# Patient Record
Sex: Female | Born: 1995 | Race: Black or African American | Hispanic: No | Marital: Single | Smoking: Former smoker
Health system: Southern US, Community
[De-identification: ages and names within clinical notes are randomized; demographics above are authoritative.]

## PROBLEM LIST (undated history)

## (undated) DIAGNOSIS — B9689 Other specified bacterial agents as the cause of diseases classified elsewhere: Secondary | ICD-10-CM

## (undated) HISTORY — PX: WRIST SURGERY: SHX841

---
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
## 2017-02-25 ENCOUNTER — Emergency Department (HOSPITAL_BASED_OUTPATIENT_CLINIC_OR_DEPARTMENT_OTHER): Payer: Medicaid Other

## 2017-02-25 ENCOUNTER — Encounter (HOSPITAL_BASED_OUTPATIENT_CLINIC_OR_DEPARTMENT_OTHER): Payer: Self-pay | Admitting: Emergency Medicine

## 2017-02-25 ENCOUNTER — Emergency Department (HOSPITAL_BASED_OUTPATIENT_CLINIC_OR_DEPARTMENT_OTHER)
Admission: EM | Admit: 2017-02-25 | Discharge: 2017-02-25 | Disposition: A | Discharge status: Home / Self Care | Payer: Medicaid Other | Attending: Emergency Medicine | Admitting: Emergency Medicine

## 2017-02-25 DIAGNOSIS — R0789 Other chest pain: Secondary | ICD-10-CM | POA: Insufficient documentation

## 2017-02-25 DIAGNOSIS — R1013 Epigastric pain: Secondary | ICD-10-CM | POA: Insufficient documentation

## 2017-02-25 MED ORDER — NAPROXEN 375 MG PO TABS: 375.0000 mg | ORAL_TABLET | Freq: Two times a day (BID) | ORAL | 0 refills | Status: AC

## 2017-02-25 MED ORDER — RANITIDINE HCL 150 MG PO TABS: 150.0000 mg | ORAL_TABLET | Freq: Two times a day (BID) | ORAL | 0 refills | Status: AC

## 2017-02-25 MED ORDER — NAPROXEN 375 MG PO TABS: 375.0000 mg | ORAL_TABLET | Freq: Two times a day (BID) | ORAL | 0 refills | Status: DC

## 2017-02-25 MED ORDER — SUCRALFATE 1 GM/10ML PO SUSP: 1.0000 g | Freq: Three times a day (TID) | ORAL | 0 refills | Status: DC

## 2017-02-25 MED ORDER — GI COCKTAIL ~~LOC~~
30.0000 mL | Freq: Once | ORAL | Status: AC
  Administered 2017-02-25: 30 mL via ORAL
  Filled 2017-02-25: qty 30

## 2017-02-25 MED ORDER — SUCRALFATE 1 GM/10ML PO SUSP: 1.0000 g | Freq: Three times a day (TID) | ORAL | 0 refills | Status: AC

## 2017-02-25 MED ORDER — RANITIDINE HCL 150 MG PO TABS: 150.0000 mg | ORAL_TABLET | Freq: Two times a day (BID) | ORAL | 0 refills | Status: DC

## 2017-02-25 NOTE — ED Triage Notes (Signed)
PT presents to ED with complaints of chest pain and epigastric pain for the past couple weeks.

## 2017-02-25 NOTE — ED Provider Notes (Signed)
[facility] Provider Note   CSN: [id number] Arrival date & time: 02/25/17  2121  By signing my name below, I, Donna Bartlett, attest that this documentation has been prepared under the direction and in the presence of physician practitioner, Shaune Pollack, MD. Electronically Signed: Linna Bartlett, Scribe. 02/25/2017. 10:27 PM.  History   Chief Complaint Chief Complaint  Patient presents with  . Chest Pain    The history is provided by the patient. No language interpreter was used.     HPI Comments: Donna Bartlett is a 21 y.o. female with no pertinent PMHx who presents to the Emergency Department complaining of intermittent, sharp, central chest pain radiating into her epigastrium for a few weeks, constant today. Her pain initially presented a few weeks ago while at rest. Patient reports that prior to today, her pain would present every couple of hours and last for "a while". She states her pain is worse with movement. No alleviating factors noted. Patient notes she has had some nausea and one episode of vomiting today. No recent increased stress. No h/o acid reflux. No FMHx of heart problems. No frequent NSAID use. No hormone therapy. She denies fevers, cough, or any other associated symptoms.  History reviewed. No pertinent past medical history.  There are no active problems to display for this patient.   History reviewed. No pertinent surgical history.  OB History    No data available       Home Medications    Prior to Admission medications   Medication Sig Start Date End Date Taking? Authorizing Provider  Aspirin-Acetaminophen-Caffeine (PAIN RELIEVER PLUS PO) Take 10 mLs by mouth daily as needed. For pain and fever.    Historical Provider, MD  naproxen (NAPROSYN) 375 MG tablet Take 1 tablet (375 mg total) by mouth 2 (two) times daily with a meal. 02/25/17 03/04/17  Shaune Pollack, MD  naproxen sodium (ANAPROX) 275 MG tablet Take 1 tablet twice daily as needed for  painful rash. 11/03/13   John Molpus, MD  Phenylephrine-Pheniramine-DM [facility] COLD & COUGH PO) Take 1 packet by mouth daily as needed. For cold symptoms    Historical Provider, MD  Pseudoeph-Doxylamine-DM-APAP (NYQUIL PO) Take 1 capsule by mouth daily as needed. For cold symptoms    Historical Provider, MD  ranitidine (ZANTAC) 150 MG tablet Take 1 tablet (150 mg total) by mouth 2 (two) times daily. 02/25/17 03/04/17  Shaune Pollack, MD  sucralfate (CARAFATE) 1 GM/10ML suspension Take 10 mLs (1 g total) by mouth 4 (four) times daily -  with meals and at bedtime. 02/25/17 03/04/17  Shaune Pollack, MD    Family History No family history on file.  Social History Social History  Substance Use Topics  . Smoking status: Never Smoker  . Smokeless tobacco: Not on file  . Alcohol use No     Allergies   Other   Review of Systems Review of Systems  Constitutional: Negative for chills, fatigue and fever.  HENT: Negative for congestion and rhinorrhea.   Eyes: Negative for visual disturbance.  Respiratory: Negative for cough, shortness of breath and wheezing.   Cardiovascular: Positive for chest pain. Negative for leg swelling.  Gastrointestinal: Positive for abdominal pain, nausea and vomiting (one episode). Negative for diarrhea.  Genitourinary: Negative for dysuria and flank pain.  Musculoskeletal: Negative for neck pain and neck stiffness.  Skin: Negative for rash and wound.  Allergic/Immunologic: Negative for immunocompromised state.  Neurological: Negative for syncope, weakness and headaches.  All other systems reviewed and are  negative.  Physical Exam Updated Vital Signs BP 114/77   Pulse 67   Temp 99.6 F (37.6 C) (Oral)   Resp 18   Ht  (1.651 m)   Wt 148 lb 5 oz (67.3 kg)   LMP 02/13/2017   SpO2 100%   BMI 24.68 kg/m   Physical Exam  Constitutional: She is oriented to person, place, and time. She appears well-developed and well-nourished. No distress.  HENT:  Head:  Normocephalic and atraumatic.  Eyes: Conjunctivae are normal.  Neck: Neck supple.  Cardiovascular: Normal rate, regular rhythm and normal heart sounds.  Exam reveals no friction rub.   No murmur heard. Pulmonary/Chest: Effort normal and breath sounds normal. No respiratory distress. She has no wheezes. She has no rales. She exhibits tenderness (significant pinpoint TTP over mid and lower sternum).  Abdominal: Soft. Bowel sounds are normal. She exhibits no distension. There is tenderness (mild epigatric TTP; no guarding). There is no rebound and no guarding.  Musculoskeletal: She exhibits no edema.  Neurological: She is alert and oriented to person, place, and time. She exhibits normal muscle tone.  Skin: Skin is warm. Capillary refill takes less than 2 seconds.  Psychiatric: She has a normal mood and affect.  Nursing note and vitals reviewed.  ED Treatments / Results  Labs (all labs ordered are listed, but only abnormal results are displayed) Labs Reviewed - No data to display  EKG  EKG Interpretation  Date/Time:  Saturday February 25 2017 22:49:21 EDT Ventricular Rate:  66 PR Interval:    QRS Duration: 80 QT Interval:  364 QTC Calculation: 382 R Axis:   73 Text Interpretation:  Sinus rhythm Short PR interval No old tracing to compare No apparent Delta waves Confirmed by Erma Heritage MD, Orlandria Kissner [phone]) on 02/25/2017 10:55:52 PM       Radiology Dg Chest 2 View  Result Date: 02/25/2017 CLINICAL DATA:  Chest pain and epigastric pain for a couple of weeks. EXAM: CHEST  2 VIEW COMPARISON:  None. FINDINGS: The heart size and mediastinal contours are within normal limits. Both lungs are clear. No pleural effusion or pneumothorax. The visualized skeletal structures are unremarkable. IMPRESSION: Normal chest radiographs. Electronically Signed   By: Amie Portland M.D.   On: 02/25/2017 22:26    Procedures Procedures (including critical care time)  DIAGNOSTIC STUDIES: Oxygen Saturation is 98%  on RA, normal by my interpretation.    COORDINATION OF CARE: 10:35 PM Discussed treatment plan with pt at bedside and pt agreed to plan.  Medications Ordered in ED Medications  gi cocktail (Maalox,Lidocaine,Donnatal) (30 mLs Oral Given 02/25/17 2303)     Initial Impression / Assessment and Plan / ED Course  I have reviewed the triage vital signs and the nursing notes.  Pertinent labs & imaging results that were available during my care of the patient were reviewed by me and considered in my medical decision making (see chart for details).    21 yo F with PMHx as above here with atypical, reproducible chest pain. Suspect MSK chest wall pain with component of GERD as well. No apparent emergent pathology. Her EKG shows shortened PR but no delta waves, no arrhythmia on tele and is non-ischemic - doubt arrhythmia. Trop neg despite sx present >12 hours - doubt ACS and she has no significant risk factors for this. She is PERC negative, no LE edema, doubt PE. Pain not c/w dissection. CXR is without focal PNA, PTX, or other abnormality. Pain improved with GI cocktail here. While  etiology remains undifferentiated (MSK versus GERD), based on labs and exam there does not appear to be an emergent or life threatening etiology. Will tx with trial of NSAIDs as well as tx for GERD, d/c home with close PCP f/u.  Final Clinical Impressions(s) / ED Diagnoses   Final diagnoses:  Chest wall pain  Atypical chest pain    New Prescriptions Discharge Medication List as of 02/25/2017 11:43 PM     I personally performed the services described in this documentation, which was scribed in my presence. The recorded information has been reviewed and is accurate.    Shaune Pollack, MD 02/26/17 1352

## 2017-07-12 ENCOUNTER — Encounter (HOSPITAL_BASED_OUTPATIENT_CLINIC_OR_DEPARTMENT_OTHER): Payer: Self-pay

## 2017-07-12 ENCOUNTER — Emergency Department (HOSPITAL_BASED_OUTPATIENT_CLINIC_OR_DEPARTMENT_OTHER)
Admission: EM | Admit: 2017-07-12 | Discharge: 2017-07-12 | Disposition: A | Discharge status: Home / Self Care | Payer: Medicaid Other | Attending: Emergency Medicine | Admitting: Emergency Medicine

## 2017-07-12 DIAGNOSIS — Z87891 Personal history of nicotine dependence: Secondary | ICD-10-CM | POA: Diagnosis not present

## 2017-07-12 DIAGNOSIS — R05 Cough: Secondary | ICD-10-CM | POA: Diagnosis present

## 2017-07-12 DIAGNOSIS — J111 Influenza due to unidentified influenza virus with other respiratory manifestations: Secondary | ICD-10-CM | POA: Insufficient documentation

## 2017-07-12 DIAGNOSIS — R69 Illness, unspecified: Secondary | ICD-10-CM

## 2017-07-12 MED ORDER — AMOXICILLIN-POT CLAVULANATE 875-125 MG PO TABS: 1.0000 | ORAL_TABLET | Freq: Two times a day (BID) | ORAL | 0 refills | Status: DC

## 2017-07-12 NOTE — ED Notes (Signed)
ED Provider at bedside. 

## 2017-07-12 NOTE — Discharge Instructions (Signed)
Take tylenol 2 pills 4 times a day and motrin 4 pills 3 times a day.  Drink plenty of fluids.  Return for worsening shortness of breath, headache, confusion. Follow up with your family doctor.   

## 2017-07-12 NOTE — ED Provider Notes (Signed)
[facility]   CSN: [id number] Arrival date & time: 07/12/17  1353     History   Chief Complaint Chief Complaint  Patient presents with  . Cough    HPI Donna Bartlett is a 21 y.o. female.  21 yo F with a chief complaint of uri. Going on for the past 3 days. Cough congestion fevers chills myalgias. Denies shortness of breath. Was exposed to a sick contact with similar illness. Sore throat and bilateral ear pain.   The history is provided by the patient.  Cough  This is a new problem. The current episode started less than 1 hour ago. The problem occurs constantly. The cough is non-productive. The maximum temperature recorded prior to her arrival was 101 to 101.9 F. The fever has been present for less than 1 day. Associated symptoms include chills and myalgias. Pertinent negatives include no chest pain, no headaches, no rhinorrhea, no shortness of breath, no wheezing and no eye redness. She has tried nothing for the symptoms. The treatment provided no relief. She is not a smoker.    History reviewed. No pertinent past medical history.  There are no active problems to display for this patient.   Past Surgical History:  Procedure Laterality Date  . WRIST SURGERY      OB History    No data available       Home Medications    Prior to Admission medications   Medication Sig Start Date End Date Taking? Authorizing Provider  amoxicillin-clavulanate (AUGMENTIN) 875-125 MG tablet Take 1 tablet by mouth every 12 (twelve) hours. 07/12/17   Melene PlanFloyd, Elmon Shader, DO  ranitidine (ZANTAC) 150 MG tablet Take 1 tablet (150 mg total) by mouth 2 (two) times daily. 02/25/17 03/04/17  Shaune PollackIsaacs, Cameron, MD  sucralfate (CARAFATE) 1 GM/10ML suspension Take 10 mLs (1 g total) by mouth 4 (four) times daily -  with meals and at bedtime. 02/25/17 03/04/17  Shaune PollackIsaacs, Cameron, MD    Family History No family history on file.  Social History Social History  Substance Use Topics  . Smoking  status: Current Every Day Smoker  . Smokeless tobacco: Never Used  . Alcohol use No     Allergies   Other   Review of Systems Review of Systems  Constitutional: Positive for chills and fever.  HENT: Positive for congestion. Negative for rhinorrhea.   Eyes: Negative for redness and visual disturbance.  Respiratory: Positive for cough. Negative for shortness of breath and wheezing.   Cardiovascular: Negative for chest pain and palpitations.  Gastrointestinal: Negative for nausea and vomiting.  Genitourinary: Negative for dysuria and urgency.  Musculoskeletal: Positive for arthralgias and myalgias.  Skin: Negative for pallor and wound.  Neurological: Negative for dizziness and headaches.     Physical Exam Updated Vital Signs BP 110/73 (BP Location: Left Arm)   Pulse (!) 123   Temp 99 F (37.2 C) (Oral)   Resp 18   Ht 5\' 5"  (1.651 m)   Wt 70.3 kg (155 lb)   SpO2 100%   BMI 25.79 kg/m   Physical Exam  Constitutional: She is oriented to person, place, and time. She appears well-developed and well-nourished. No distress.  HENT:  Head: Normocephalic and atraumatic.  Swollen turbinates, posterior nasal drip, max sinus ttp bilaterally, tm normal bilaterally.    Eyes: Pupils are equal, round, and reactive to light. EOM are normal.  Neck: Normal range of motion. Neck supple.  Cardiovascular: Normal rate and regular rhythm.  Exam reveals no  gallop and no friction rub.   No murmur heard. Pulmonary/Chest: Effort normal. She has no wheezes. She has no rales.  Abdominal: Soft. She exhibits no distension. There is no tenderness.  Musculoskeletal: She exhibits no edema or tenderness.  Neurological: She is alert and oriented to person, place, and time.  Skin: Skin is warm and dry. She is not diaphoretic.  Psychiatric: She has a normal mood and affect. Her behavior is normal.  Nursing note and vitals reviewed.    ED Treatments / Results  Labs (all labs ordered are listed, but  only abnormal results are displayed) Labs Reviewed - No data to display  EKG  EKG Interpretation None       Radiology No results found.  Procedures Procedures (including critical care time)  Medications Ordered in ED Medications - No data to display   Initial Impression / Assessment and Plan / ED Course  I have reviewed the triage vital signs and the nursing notes.  Pertinent labs & imaging results that were available during my care of the patient were reviewed by me and considered in my medical decision making (see chart for details).     21 yo F With a chief complaints of influenza-like illness. Will treat supportively. They do have exquisite tenderness sinuses on exam. Will give Augmentin. Discharge home.  3:13 PM:  I have discussed the diagnosis/risks/treatment options with the patient and believe the pt to be eligible for discharge home to follow-up with PCP. We also discussed returning to the ED immediately if new or worsening sx occur. We discussed the sx which are most concerning (e.g., sudden worsening pain, fever, inability to tolerate by mouth) that necessitate immediate return. Medications administered to the patient during their visit and any new prescriptions provided to the patient are listed below.  Medications given during this visit Medications - No data to display   The patient appears reasonably screen and/or stabilized for discharge and I doubt any other medical condition or other [facility] requiring further screening, evaluation, or treatment in the ED at this time prior to discharge.    Final Clinical Impressions(s) / ED Diagnoses   Final diagnoses:  Influenza-like illness    New Prescriptions Discharge Medication List as of 07/12/2017  2:48 PM       Melene Plan, DO 07/12/17 1513

## 2017-07-12 NOTE — ED Triage Notes (Signed)
C/o flu like sx x 3 days-NAD-steady gait 

## 2017-08-13 ENCOUNTER — Emergency Department (HOSPITAL_BASED_OUTPATIENT_CLINIC_OR_DEPARTMENT_OTHER): Payer: Medicaid Other

## 2017-08-13 ENCOUNTER — Encounter (HOSPITAL_BASED_OUTPATIENT_CLINIC_OR_DEPARTMENT_OTHER): Payer: Self-pay | Admitting: Emergency Medicine

## 2017-08-13 ENCOUNTER — Emergency Department (HOSPITAL_BASED_OUTPATIENT_CLINIC_OR_DEPARTMENT_OTHER)
Admission: EM | Admit: 2017-08-13 | Discharge: 2017-08-13 | Disposition: A | Discharge status: Home / Self Care | Payer: Medicaid Other | Attending: Emergency Medicine | Admitting: Emergency Medicine

## 2017-08-13 DIAGNOSIS — Y999 Unspecified external cause status: Secondary | ICD-10-CM | POA: Diagnosis not present

## 2017-08-13 DIAGNOSIS — W2201XA Walked into wall, initial encounter: Secondary | ICD-10-CM | POA: Insufficient documentation

## 2017-08-13 DIAGNOSIS — Y92091 Bathroom in other non-institutional residence as the place of occurrence of the external cause: Secondary | ICD-10-CM | POA: Insufficient documentation

## 2017-08-13 DIAGNOSIS — F17200 Nicotine dependence, unspecified, uncomplicated: Secondary | ICD-10-CM | POA: Insufficient documentation

## 2017-08-13 DIAGNOSIS — Y9301 Activity, walking, marching and hiking: Secondary | ICD-10-CM | POA: Insufficient documentation

## 2017-08-13 DIAGNOSIS — S99921A Unspecified injury of right foot, initial encounter: Secondary | ICD-10-CM | POA: Diagnosis present

## 2017-08-13 DIAGNOSIS — S92511A Displaced fracture of proximal phalanx of right lesser toe(s), initial encounter for closed fracture: Secondary | ICD-10-CM

## 2017-08-13 DIAGNOSIS — S92514A Nondisplaced fracture of proximal phalanx of right lesser toe(s), initial encounter for closed fracture: Secondary | ICD-10-CM | POA: Diagnosis not present

## 2017-08-13 MED ORDER — IBUPROFEN 400 MG PO TABS
600.0000 mg | ORAL_TABLET | Freq: Once | ORAL | Status: AC
  Administered 2017-08-13: 600 mg via ORAL
  Filled 2017-08-13: qty 1

## 2017-08-13 NOTE — ED Provider Notes (Signed)
[facility]   CSN: [id number] Arrival date & time: 08/13/17  4098     History   Chief Complaint Chief Complaint  Patient presents with  . Toe Injury    HPI Donna Bartlett is a 21 y.o. female.  The history is provided by the patient.  Toe Pain  This is a new problem. The current episode started 1 to 2 hours ago. The problem occurs constantly. The problem has not changed since onset.The symptoms are aggravated by walking. Nothing relieves the symptoms. She has tried nothing for the symptoms.   21 year old female who presents with right pinky toe injury. States that she accidentally jammed her foot into the wall while walking out of the bathroom today. Noticed swelling, mild deformity, and pain to the right pinky toe. Denies fall or any other injuries. Denies numbness or weakness. Does have some difficulty ambulating since her injury. History reviewed. No pertinent past medical history.  There are no active problems to display for this patient.   Past Surgical History:  Procedure Laterality Date  . WRIST SURGERY      OB History    No data available       Home Medications    Prior to Admission medications   Medication Sig Start Date End Date Taking? Authorizing Provider  amoxicillin-clavulanate (AUGMENTIN) 875-125 MG tablet Take 1 tablet by mouth every 12 (twelve) hours. 07/12/17   Melene Plan, DO  ranitidine (ZANTAC) 150 MG tablet Take 1 tablet (150 mg total) by mouth 2 (two) times daily. 02/25/17 03/04/17  Shaune Pollack, MD  sucralfate (CARAFATE) 1 GM/10ML suspension Take 10 mLs (1 g total) by mouth 4 (four) times daily -  with meals and at bedtime. 02/25/17 03/04/17  Shaune Pollack, MD    Family History History reviewed. No pertinent family history.  Social History Social History  Substance Use Topics  . Smoking status: Current Every Day Smoker  . Smokeless tobacco: Never Used  . Alcohol use No     Allergies   Other   Review of  Systems Review of Systems  Constitutional: Negative for fever.  Cardiovascular: Negative for leg swelling.  Musculoskeletal:       Right pinky toe injury   Skin: Negative for wound.  Allergic/Immunologic: Negative for immunocompromised state.  Hematological: Does not bruise/bleed easily.     Physical Exam Updated Vital Signs BP 110/65 (BP Location: Right Arm)   Pulse 71   Temp 98.4 F (36.9 C) (Oral)   Resp 18   Ht  (1.651 m)   Wt 70.3 kg (155 lb)   LMP 07/15/2017   SpO2 100%   BMI 25.79 kg/m   Physical Exam Physical Exam  Constitutional: Appears well-developed and well-nourished. No acute distress. HENT:  Head: Normocephalic.  Eyes: Conjunctivae are normal.  Cardiovascular: Normal rate and intact distal pulses.  +2 DP pulses Pulmonary/Chest: Effort normal. No respiratory distress.  Abdominal: Exhibits no distension.  Musculoskeletal: small soft tissue swelling and deformity to the right pinky toe. Neurological: Alert. Fluent speech. Sensation to light touch intact in bilateral lower extremities. Full strength ankle dorsi and plantar flexion Skin: Skin is warm and dry.  Psychiatric: Normal mood and affect. Behavior is normal.  Nursing note and vitals reviewed.    ED Treatments / Results  Labs (all labs ordered are listed, but only abnormal results are displayed) Labs Reviewed - No data to display  EKG  EKG Interpretation None       Radiology  Dg Foot Complete Right  Result Date: 08/13/2017 CLINICAL DATA:  Blow to the right little toe on a wall today while walking. Initial encounter. EXAM: RIGHT FOOT COMPLETE - 3+ VIEW COMPARISON:  None. FINDINGS: The patient has an acute fracture of the distal metaphysis of the proximal phalanx of the little toe. The fracture is incomplete and shows minimal lateral angulation. The articular surface of the proximal phalanx is not disrupted. No other abnormality is seen. IMPRESSION: Incomplete fracture of the distal  metaphysis of the proximal phalanx of the little toe. The fracture shows slight lateral angulation. No disruption of the articular surface. Electronically Signed   By: Drusilla Kanner M.D.   On: 08/13/2017 10:37    Procedures Procedures (including critical care time)  Medications Ordered in ED Medications  ibuprofen (ADVIL,MOTRIN) tablet 600 mg (600 mg Oral Given 08/13/17 1132)     Initial Impression / Assessment and Plan / ED Course  I have reviewed the triage vital signs and the nursing notes.  Pertinent labs & imaging results that were available during my care of the patient were reviewed by me and considered in my medical decision making (see chart for details).     xr visualized. If there is a incomplete fracture of the distal portion of the proximal phalanx of the little toe.discussed supportive care. Toe is buddy taped. Ibuprofen provided for pain control. Will follow-up with Dr. Pearletha Forge. Strict return and follow-up instructions reviewed. She expressed understanding of all discharge instructions and felt comfortable with the plan of care.   Final Clinical Impressions(s) / ED Diagnoses   Final diagnoses:  Closed displaced fracture of middle phalanx of lesser toe of right foot, initial encounter    New Prescriptions New Prescriptions   No medications on file     [name], MD 08/13/17 1144

## 2017-08-13 NOTE — Discharge Instructions (Signed)
Please call Dr. Pearletha Forge for follow-up of your toe fracture.  Return for worsening symptoms, including escalating pain, or any other symptoms concerning ot you  Take ibuprofen for pain

## 2017-08-13 NOTE — ED Notes (Signed)
Patient is A & O x4.  She understood AVS instructions and follow up care.

## 2017-08-13 NOTE — ED Triage Notes (Signed)
Patient states that she was walking out of her bathroom and she hurt her right little toe. Toe is deformed

## 2017-08-23 ENCOUNTER — Ambulatory Visit: Payer: Medicaid Other | Admitting: Family Medicine

## 2017-08-24 ENCOUNTER — Ambulatory Visit (INDEPENDENT_AMBULATORY_CARE_PROVIDER_SITE_OTHER): Payer: Medicaid Other | Admitting: Family Medicine

## 2017-08-24 ENCOUNTER — Encounter: Payer: Self-pay | Admitting: Family Medicine

## 2017-08-24 DIAGNOSIS — S99921A Unspecified injury of right foot, initial encounter: Secondary | ICD-10-CM | POA: Diagnosis not present

## 2017-08-24 NOTE — Patient Instructions (Signed)
You have a proximal phalanx fracture of your pinky toe. Buddy tape this until you're at least 4 weeks out (so through November 21st). Wear supportive shoes and try to avoid barefoot walking, flat shoes in this time also. Tylenol, motrin only if needed. Activities as tolerated. Follow up with me in 4 weeks only if you're having any problems - otherwise follow up with me as needed.

## 2017-08-25 ENCOUNTER — Ambulatory Visit: Payer: Self-pay | Admitting: Physician Assistant

## 2017-08-25 DIAGNOSIS — S99921A Unspecified injury of right foot, initial encounter: Secondary | ICD-10-CM | POA: Insufficient documentation

## 2017-08-25 NOTE — Progress Notes (Signed)
PCP: System, Pcp Not In  Subjective:   HPI: Patient is a 21 y.o. female here for right toe injury.  Patient reports on 10/14 she was walking and accidentally struck her right little toe on the bottom of a door. Immediate pain, swelling, looked like little toe was angled to the outside. Pain down to 0/10 - has had some soreness but much better with buddy taping. Wore boot until yesterday. Not taking any medicines or icing. No skin changes, numbness.  No past medical history on file.  Current Outpatient Prescriptions on File Prior to Visit  Medication Sig Dispense Refill  . amoxicillin-clavulanate (AUGMENTIN) 875-125 MG tablet Take 1 tablet by mouth every 12 (twelve) hours. 14 tablet 0  . ranitidine (ZANTAC) 150 MG tablet Take 1 tablet (150 mg total) by mouth 2 (two) times daily. 14 tablet 0  . sucralfate (CARAFATE) 1 GM/10ML suspension Take 10 mLs (1 g total) by mouth 4 (four) times daily -  with meals and at bedtime. 420 mL 0   No current facility-administered medications on file prior to visit.     Past Surgical History:  Procedure Laterality Date  . WRIST SURGERY      Allergies  Allergen Reactions  . Other Swelling    Hazel nut allergy    Social History   Social History  . Marital status: Single    Spouse name: N/A  . Number of children: N/A  . Years of education: N/A   Occupational History  . Not on file.   Social History Main Topics  . Smoking status: Current Every Day Smoker  . Smokeless tobacco: Never Used  . Alcohol use No  . Drug use: Yes    Types: Marijuana  . Sexual activity: Not on file   Other Topics Concern  . Not on file   Social History Narrative  . No narrative on file    No family history on file.  BP 112/67   Pulse (!) 106   Ht 5\' 5"  (1.651 m)   Wt 155 lb (70.3 kg)   BMI 25.79 kg/m   Review of Systems: See HPI above.     Objective:  Physical Exam:  Gen: NAD, comfortable in exam room  Right foot/ankle: No malrotation,  angulation of digits.  Mild swelling throughout 5th digit.  No bruising.  No other deformity. FROM ankle.  Limited motion of 5th digit. TTP mildly proximally 5th digit.  No other tenderness. Negative ant drawer and talar tilt.   Negative syndesmotic compression. Thompsons test negative. NV intact distally.   Left foot/ankle: FROM without pain.  Assessment & Plan:  1. Right 5th digit injury - independently reviewed radiographs and noted proximal phalanx fracture.  Doing very well clinically.  Continue buddy taping until at least 4 weeks out.  Supportive shoes.  Tylenol or motrin if needed.  Activities as tolerated.  F/u in 4 weeks if struggling otherwise f/u prn.

## 2017-08-25 NOTE — Assessment & Plan Note (Signed)
independently reviewed radiographs and noted proximal phalanx fracture.  Doing very well clinically.  Continue buddy taping until at least 4 weeks out.  Supportive shoes.  Tylenol or motrin if needed.  Activities as tolerated.  F/u in 4 weeks if struggling otherwise f/u prn.

## 2017-12-18 ENCOUNTER — Emergency Department (HOSPITAL_BASED_OUTPATIENT_CLINIC_OR_DEPARTMENT_OTHER): Payer: Medicaid Other

## 2017-12-18 ENCOUNTER — Other Ambulatory Visit: Payer: Self-pay

## 2017-12-18 ENCOUNTER — Emergency Department (HOSPITAL_BASED_OUTPATIENT_CLINIC_OR_DEPARTMENT_OTHER)
Admission: EM | Admit: 2017-12-18 | Discharge: 2017-12-18 | Disposition: A | Discharge status: Home / Self Care | Payer: Medicaid Other | Attending: Emergency Medicine | Admitting: Emergency Medicine

## 2017-12-18 ENCOUNTER — Encounter (HOSPITAL_BASED_OUTPATIENT_CLINIC_OR_DEPARTMENT_OTHER): Payer: Self-pay | Admitting: Emergency Medicine

## 2017-12-18 DIAGNOSIS — M25511 Pain in right shoulder: Secondary | ICD-10-CM | POA: Insufficient documentation

## 2017-12-18 DIAGNOSIS — Z5321 Procedure and treatment not carried out due to patient leaving prior to being seen by health care provider: Secondary | ICD-10-CM | POA: Insufficient documentation

## 2017-12-18 NOTE — ED Triage Notes (Addendum)
Patient reports right shoulder pain since yesterday.  Reports intermittent problems with this in the past.  Denies injury.  States she has never been treated for this before.

## 2018-02-15 ENCOUNTER — Other Ambulatory Visit: Payer: Self-pay

## 2018-02-15 ENCOUNTER — Encounter (HOSPITAL_BASED_OUTPATIENT_CLINIC_OR_DEPARTMENT_OTHER): Payer: Self-pay

## 2018-02-15 DIAGNOSIS — F17290 Nicotine dependence, other tobacco product, uncomplicated: Secondary | ICD-10-CM | POA: Insufficient documentation

## 2018-02-15 DIAGNOSIS — B9689 Other specified bacterial agents as the cause of diseases classified elsewhere: Secondary | ICD-10-CM | POA: Diagnosis not present

## 2018-02-15 DIAGNOSIS — Z79899 Other long term (current) drug therapy: Secondary | ICD-10-CM | POA: Insufficient documentation

## 2018-02-15 DIAGNOSIS — N760 Acute vaginitis: Secondary | ICD-10-CM | POA: Diagnosis not present

## 2018-02-15 DIAGNOSIS — R102 Pelvic and perineal pain: Secondary | ICD-10-CM | POA: Diagnosis present

## 2018-02-15 NOTE — ED Triage Notes (Signed)
Pt c/o lower abd pain x1 week. Denies N/V/D.

## 2018-02-16 ENCOUNTER — Emergency Department (HOSPITAL_BASED_OUTPATIENT_CLINIC_OR_DEPARTMENT_OTHER)
Admission: EM | Admit: 2018-02-16 | Discharge: 2018-02-16 | Disposition: A | Discharge status: Home / Self Care | Payer: Medicaid Other | Attending: Emergency Medicine | Admitting: Emergency Medicine

## 2018-02-16 DIAGNOSIS — N760 Acute vaginitis: Secondary | ICD-10-CM

## 2018-02-16 DIAGNOSIS — R102 Pelvic and perineal pain: Secondary | ICD-10-CM

## 2018-02-16 DIAGNOSIS — B9689 Other specified bacterial agents as the cause of diseases classified elsewhere: Secondary | ICD-10-CM

## 2018-02-16 LAB — COMPREHENSIVE METABOLIC PANEL
ALT: 19 U/L (ref 14–54)
ANION GAP: 9 (ref 5–15)
AST: 17 U/L (ref 15–41)
Albumin: 3.8 g/dL (ref 3.5–5.0)
Alkaline Phosphatase: 46 U/L (ref 38–126)
BILIRUBIN TOTAL: 0.3 mg/dL (ref 0.3–1.2)
BUN: 12 mg/dL (ref 6–20)
CO2: 23 mmol/L (ref 22–32)
Calcium: 8.9 mg/dL (ref 8.9–10.3)
Chloride: 104 mmol/L (ref 101–111)
Creatinine, Ser: 0.8 mg/dL (ref 0.44–1.00)
Glucose, Bld: 98 mg/dL (ref 65–99)
POTASSIUM: 3.8 mmol/L (ref 3.5–5.1)
Sodium: 136 mmol/L (ref 135–145)
TOTAL PROTEIN: 7 g/dL (ref 6.5–8.1)

## 2018-02-16 LAB — WET PREP, GENITAL
SPERM: NONE SEEN
TRICH WET PREP: NONE SEEN
Yeast Wet Prep HPF POC: NONE SEEN

## 2018-02-16 LAB — CBC
HEMATOCRIT: 33.3 % — AB (ref 36.0–46.0)
HEMOGLOBIN: 11.8 g/dL — AB (ref 12.0–15.0)
MCH: 26.9 pg (ref 26.0–34.0)
MCHC: 35.4 g/dL (ref 30.0–36.0)
MCV: 76 fL — AB (ref 78.0–100.0)
Platelets: 350 10*3/uL (ref 150–400)
RBC: 4.38 MIL/uL (ref 3.87–5.11)
RDW: 14.4 % (ref 11.5–15.5)
WBC: 7.5 10*3/uL (ref 4.0–10.5)

## 2018-02-16 LAB — URINALYSIS, MICROSCOPIC (REFLEX)

## 2018-02-16 LAB — URINALYSIS, ROUTINE W REFLEX MICROSCOPIC
Bilirubin Urine: NEGATIVE
Glucose, UA: NEGATIVE mg/dL
Ketones, ur: NEGATIVE mg/dL
Leukocytes, UA: NEGATIVE
NITRITE: NEGATIVE
PROTEIN: NEGATIVE mg/dL
pH: 6 (ref 5.0–8.0)

## 2018-02-16 LAB — PREGNANCY, URINE: PREG TEST UR: NEGATIVE

## 2018-02-16 LAB — LIPASE, BLOOD: LIPASE: 32 U/L (ref 11–51)

## 2018-02-16 MED ORDER — DOXYCYCLINE HYCLATE 100 MG PO TABS
100.0000 mg | ORAL_TABLET | Freq: Once | ORAL | Status: AC
  Administered 2018-02-16: 100 mg via ORAL
  Filled 2018-02-16: qty 1

## 2018-02-16 MED ORDER — METRONIDAZOLE 500 MG PO TABS
500.0000 mg | ORAL_TABLET | Freq: Once | ORAL | Status: AC
Start: 2018-02-16 — End: 2018-02-16
  Administered 2018-02-16: 500 mg via ORAL
  Filled 2018-02-16: qty 1

## 2018-02-16 MED ORDER — METRONIDAZOLE 500 MG PO TABS: 500.0000 mg | ORAL_TABLET | Freq: Three times a day (TID) | ORAL | 0 refills | Status: DC

## 2018-02-16 MED ORDER — DOXYCYCLINE HYCLATE 100 MG PO CAPS: 100.0000 mg | ORAL_CAPSULE | Freq: Two times a day (BID) | ORAL | 0 refills | Status: DC

## 2018-02-16 MED ORDER — CEFTRIAXONE SODIUM 250 MG IJ SOLR
250.0000 mg | Freq: Once | INTRAMUSCULAR | Status: AC
  Administered 2018-02-16: 250 mg via INTRAMUSCULAR
  Filled 2018-02-16: qty 250

## 2018-02-16 NOTE — Discharge Instructions (Addendum)
Take the antibiotics as prescribed.  Follow safe sex practices.  You may need to have an ultrasound scheduled by her primary doctor.  Return to the ED if you develop new or worsening symptoms.

## 2018-02-16 NOTE — ED Provider Notes (Signed)
[facility] EMERGENCY DEPARTMENT Provider Note   CSN: [id number] Arrival date & time: 02/15/18  2333     History   Chief Complaint Chief Complaint  Patient presents with  . Abdominal Pain    HPI Donna Bartlett is a 22 y.o. female.   Patient with 1 week of lower abdominal pain worse in the midline.  The pain comes and goes in intensity but never goes away completely.  She has not taken anything for it.  Denies any pain with urination or blood in the urine.  Denies any possibility of pregnancy.  She has had some vaginal discharge but no bleeding.  No fever, chills, nausea, vomiting or diarrhea.  She is never had this kind of pain in the past.  Pain was in her back several days ago but is no longer.  The history is provided by the patient.  Abdominal Pain   Pertinent negatives include fever, nausea, vomiting, dysuria, headaches, arthralgias and myalgias.    History reviewed. No pertinent past medical history.  Patient Active Problem List   Diagnosis Date Noted  . Injury of right toe, initial encounter 08/25/2017    Past Surgical History:  Procedure Laterality Date  . WRIST SURGERY       OB History   None      Home Medications    Prior to Admission medications   Medication Sig Start Date End Date Taking? Authorizing Provider  amoxicillin-clavulanate (AUGMENTIN) 875-125 MG tablet Take 1 tablet by mouth every 12 (twelve) hours. 07/12/17   Melene PlanFloyd, Dan, DO  ranitidine (ZANTAC) 150 MG tablet Take 1 tablet (150 mg total) by mouth 2 (two) times daily. 02/25/17 03/04/17  Shaune PollackIsaacs, Cameron, MD  sucralfate (CARAFATE) 1 GM/10ML suspension Take 10 mLs (1 g total) by mouth 4 (four) times daily -  with meals and at bedtime. 02/25/17 03/04/17  Shaune PollackIsaacs, Cameron, MD    Family History No family history on file.  Social History Social History   Tobacco Use  . Smoking status: Current Every Day Smoker    Types: Cigars  . Smokeless tobacco: Never Used  Substance Use Topics  .  Alcohol use: No  . Drug use: Yes    Frequency: 5.0 times per week    Types: Marijuana    Comment: last use 1 month ago (12/2017)     Allergies   Other   Review of Systems Review of Systems  Constitutional: Negative for activity change, appetite change and fever.  HENT: Negative for congestion and rhinorrhea.   Eyes: Negative for visual disturbance.  Respiratory: Negative for cough, chest tightness and shortness of breath.   Cardiovascular: Negative for chest pain.  Gastrointestinal: Positive for abdominal pain. Negative for nausea and vomiting.  Genitourinary: Positive for pelvic pain. Negative for dysuria, flank pain, urgency, vaginal bleeding and vaginal discharge.  Musculoskeletal: Negative for arthralgias, back pain, myalgias, neck pain and neck stiffness.  Skin: Negative for rash.  Neurological: Negative for dizziness, weakness and headaches.   all other systems are negative except as noted in the HPI and PMH.     Physical Exam Updated Vital Signs BP 109/75 (BP Location: Left Arm)   Pulse 86   Temp 98.2 F (36.8 C) (Oral)   Resp 16   Ht 5\' 5"  (1.651 m)   Wt 71.7 kg (158 lb)   LMP 01/27/2018   SpO2 100%   BMI 26.29 kg/m   Physical Exam  Constitutional: She is oriented to person, place, and time. She appears well-developed  and well-nourished. No distress.  HENT:  Head: Normocephalic and atraumatic.  Mouth/Throat: Oropharynx is clear and moist. No oropharyngeal exudate.  Eyes: Pupils are equal, round, and reactive to light. Conjunctivae and EOM are normal.  Neck: Normal range of motion. Neck supple.  No meningismus.  Cardiovascular: Normal rate, regular rhythm, normal heart sounds and intact distal pulses.  No murmur heard. Pulmonary/Chest: Effort normal and breath sounds normal. No respiratory distress.  Abdominal: Soft. There is no tenderness. There is no rebound and no guarding.  Suprapubic tenderness with voluntary guarding. No RLQ tenderness No  periumbilical tenderness  Genitourinary: Vaginal discharge found.  Genitourinary Comments: Chaperone present.  Normal external genitalia.  White discharge in vaginal vault.  No CMT.  There is midline uterine tenderness.  No lateralizing adnexal tenderness  Musculoskeletal: Normal range of motion. She exhibits no edema or tenderness.  No CVAT  Neurological: She is alert and oriented to person, place, and time. No cranial nerve deficit. She exhibits normal muscle tone. Coordination normal.  No ataxia on finger to nose bilaterally. No pronator drift. 5/5 strength throughout. CN 2-12 intact.Equal grip strength. Sensation intact.   Skin: Skin is warm.  Psychiatric: She has a normal mood and affect. Her behavior is normal.  Nursing note and vitals reviewed.    ED Treatments / Results  Labs (all labs ordered are listed, but only abnormal results are displayed) Labs Reviewed  WET PREP, GENITAL - Abnormal; Notable for the following components:      Result Value   Clue Cells Wet Prep HPF POC PRESENT (*)    WBC, Wet Prep HPF POC MANY (*)    All other components within normal limits  CBC - Abnormal; Notable for the following components:   Hemoglobin 11.8 (*)    HCT 33.3 (*)    MCV 76.0 (*)    All other components within normal limits  URINALYSIS, ROUTINE W REFLEX MICROSCOPIC - Abnormal; Notable for the following components:   APPearance CLOUDY (*)    Specific Gravity, Urine >1.030 (*)    Hgb urine dipstick TRACE (*)    All other components within normal limits  URINALYSIS, MICROSCOPIC (REFLEX) - Abnormal; Notable for the following components:   Bacteria, UA MANY (*)    Squamous Epithelial / LPF 6-30 (*)    All other components within normal limits  LIPASE, BLOOD  COMPREHENSIVE METABOLIC PANEL  PREGNANCY, URINE  GC/CHLAMYDIA PROBE AMP ([place]) NOT AT [facility]    EKG None  Radiology No results found.  Procedures Procedures (including critical care time)  Medications Ordered in  ED Medications - No data to display   Initial Impression / Assessment and Plan / ED Course  I have reviewed the triage vital signs and the nursing notes.  Pertinent labs & imaging results that were available during my care of the patient were reviewed by me and considered in my medical decision making (see chart for details).    Patient with 1 week of pelvic pain and vaginal discharge.  No nausea, vomiting, diarrhea or fever.  No pain with urination or blood in the urine.  HCG is negative.  Urinalysis is negative.  Pelvic exam with midline uterine tenderness.  No right lower quadrant tenderness.  Low suspicion for appendicitis. No fever or leukocytosis. No anorexia.   Patient will be treated for PID as well as bacterial vaginosis. Rocephin, doxycycline, and flagyl given.  Safe sex practices discussed.  Follow-up with PCP for pelvic ultrasound.  Return precautions discussed.  Final  Clinical Impressions(s) / ED Diagnoses   Final diagnoses:  Pelvic pain in female  Bacterial vaginosis    ED Discharge Orders    None       Thyra Yinger, Jeannett Senior, MD 02/16/18 [phone]

## 2018-02-19 LAB — GC/CHLAMYDIA PROBE AMP (~~LOC~~) NOT AT ARMC
CHLAMYDIA, DNA PROBE: NEGATIVE
NEISSERIA GONORRHEA: NEGATIVE

## 2018-04-10 ENCOUNTER — Other Ambulatory Visit: Payer: Self-pay

## 2018-04-10 ENCOUNTER — Emergency Department (HOSPITAL_BASED_OUTPATIENT_CLINIC_OR_DEPARTMENT_OTHER): Payer: Medicaid Other

## 2018-04-10 ENCOUNTER — Encounter (HOSPITAL_BASED_OUTPATIENT_CLINIC_OR_DEPARTMENT_OTHER): Payer: Self-pay | Admitting: Emergency Medicine

## 2018-04-10 ENCOUNTER — Emergency Department (HOSPITAL_BASED_OUTPATIENT_CLINIC_OR_DEPARTMENT_OTHER)
Admission: EM | Admit: 2018-04-10 | Discharge: 2018-04-10 | Disposition: A | Discharge status: Home / Self Care | Payer: Medicaid Other | Attending: Emergency Medicine | Admitting: Emergency Medicine

## 2018-04-10 DIAGNOSIS — F17290 Nicotine dependence, other tobacco product, uncomplicated: Secondary | ICD-10-CM | POA: Diagnosis not present

## 2018-04-10 DIAGNOSIS — R05 Cough: Secondary | ICD-10-CM | POA: Insufficient documentation

## 2018-04-10 DIAGNOSIS — J029 Acute pharyngitis, unspecified: Secondary | ICD-10-CM | POA: Diagnosis present

## 2018-04-10 DIAGNOSIS — Z79899 Other long term (current) drug therapy: Secondary | ICD-10-CM | POA: Diagnosis not present

## 2018-04-10 DIAGNOSIS — J069 Acute upper respiratory infection, unspecified: Secondary | ICD-10-CM | POA: Insufficient documentation

## 2018-04-10 LAB — RAPID STREP SCREEN (MED CTR MEBANE ONLY): STREPTOCOCCUS, GROUP A SCREEN (DIRECT): NEGATIVE

## 2018-04-10 LAB — PREGNANCY, URINE: Preg Test, Ur: NEGATIVE

## 2018-04-10 MED ORDER — FLUTICASONE PROPIONATE 50 MCG/ACT NA SUSP: 1.0000 | Freq: Every day | NASAL | 0 refills | Status: AC

## 2018-04-10 MED ORDER — IBUPROFEN 800 MG PO TABS: 800.0000 mg | ORAL_TABLET | Freq: Three times a day (TID) | ORAL | 0 refills | Status: DC | PRN

## 2018-04-10 MED ORDER — BENZONATATE 100 MG PO CAPS: 100.0000 mg | ORAL_CAPSULE | Freq: Three times a day (TID) | ORAL | 0 refills | Status: AC

## 2018-04-10 NOTE — ED Provider Notes (Signed)
[facility] EMERGENCY DEPARTMENT Provider Note   CSN: [id number] Arrival date & time: 04/10/18  1033     History   Chief Complaint Chief Complaint  Patient presents with  . Cough  . Sore Throat    HPI Donna Bartlett is a 22 y.o. female with a hx of tobacco abuse who presents to the ED with complaint of sore throat x 5 days. Patient states she first developed congestion and sneezing, subsequently developed sore throat, subjective fever, and cough that is intermittently productive with clear sputum. She states she has also had some gradual onset steady progression headaches as well, located in posterior aspect of the head, similar to previous. She states her symptoms have been progressively worsening. She has not had much appetite therefore has had decreased fluid intake. She has tried tylenol and mucinex without significant relief. No other specific alleviating or aggravating factors. Denies inability to swallow, nausea, vomiting, neck stiffness, rashes, recent tic exposures, or recent outdoor/wooded activity.   HPI  History reviewed. No pertinent past medical history.  Patient Active Problem List   Diagnosis Date Noted  . Injury of right toe, initial encounter 08/25/2017    Past Surgical History:  Procedure Laterality Date  . WRIST SURGERY       OB History   None      Home Medications    Prior to Admission medications   Medication Sig Start Date End Date Taking? Authorizing Provider  amoxicillin-clavulanate (AUGMENTIN) 875-125 MG tablet Take 1 tablet by mouth every 12 (twelve) hours. 07/12/17   Melene Plan, DO  doxycycline (VIBRAMYCIN) 100 MG capsule Take 1 capsule (100 mg total) by mouth 2 (two) times daily. 02/16/18   Rancour, Jeannett Senior, MD  metroNIDAZOLE (FLAGYL) 500 MG tablet Take 1 tablet (500 mg total) by mouth 3 (three) times daily. 02/16/18   Rancour, Jeannett Senior, MD  ranitidine (ZANTAC) 150 MG tablet Take 1 tablet (150 mg total) by mouth 2 (two) times daily.  02/25/17 03/04/17  Shaune Pollack, MD  sucralfate (CARAFATE) 1 GM/10ML suspension Take 10 mLs (1 g total) by mouth 4 (four) times daily -  with meals and at bedtime. 02/25/17 03/04/17  Shaune Pollack, MD    Family History No family history on file.  Social History Social History   Tobacco Use  . Smoking status: Current Every Day Smoker    Types: Cigars  . Smokeless tobacco: Never Used  Substance Use Topics  . Alcohol use: No  . Drug use: Yes    Frequency: 5.0 times per week    Types: Marijuana    Comment: last use 1 month ago (12/2017)     Allergies   Other   Review of Systems Review of Systems  Constitutional: Positive for fever (subjective).  HENT: Positive for congestion, ear pain (pressure at times), rhinorrhea, sneezing and sore throat. Negative for drooling, trouble swallowing and voice change.   Respiratory: Positive for cough. Negative for shortness of breath.   Cardiovascular: Negative for chest pain.  Skin: Negative for rash.   Physical Exam Updated Vital Signs BP 113/77 (BP Location: Left Arm)   Pulse (!) 115   Temp 99.3 F (37.4 C) (Oral)   Resp 18   Ht 5\' 5"  (1.651 m)   Wt 72.6 kg (160 lb)   LMP 03/21/2018   SpO2 99%   BMI 26.63 kg/m   Physical Exam  Constitutional: She appears well-developed and well-nourished.  Non-toxic appearance. No distress.  HENT:  Head: Normocephalic and atraumatic.  Right Ear:  No drainage or swelling. No mastoid tenderness. Tympanic membrane is not perforated, not erythematous, not retracted and not bulging.  Left Ear: No drainage or swelling. No mastoid tenderness. Tympanic membrane is not perforated, not erythematous, not retracted and not bulging.  Nose: Mucosal edema (congestion) present. Right sinus exhibits no maxillary sinus tenderness and no frontal sinus tenderness. Left sinus exhibits no maxillary sinus tenderness and no frontal sinus tenderness.  Mouth/Throat: Uvula is midline. Posterior oropharyngeal erythema  present. No oropharyngeal exudate. Tonsils are 2+ on the right. Tonsils are 2+ on the left.  Patient tolerating her own secretions without difficulty. No trismus. No drooling. No hot potato voice. Submandibular compartment is soft.   Eyes: Pupils are equal, round, and reactive to light. Conjunctivae are normal. Right eye exhibits no discharge. Left eye exhibits no discharge.  Neck: Normal range of motion. Neck supple.  Cardiovascular: Normal rate and regular rhythm.  No murmur heard. Pulmonary/Chest: Effort normal and breath sounds normal. No respiratory distress. She has no wheezes. She has no rales.  Abdominal: Soft. She exhibits no distension. There is no tenderness.  Lymphadenopathy:    She has cervical adenopathy (mild anterior).  Neurological: She is alert.  Skin: Skin is warm and dry. No rash noted.  Psychiatric: She has a normal mood and affect. Her behavior is normal.  Nursing note and vitals reviewed.   ED Treatments / Results  Labs Results for orders placed or performed during the hospital encounter of 04/10/18  Rapid Strep Screen ([facility])  Result Value Ref Range   Streptococcus, Group A Screen (Direct) NEGATIVE NEGATIVE  Pregnancy, urine  Result Value Ref Range   Preg Test, Ur NEGATIVE NEGATIVE    None  Radiology Dg Chest 2 View  Result Date: 04/10/2018 CLINICAL DATA:  Cough.  Fever. EXAM: CHEST - 2 VIEW COMPARISON:  02/25/2017. FINDINGS: Mediastinum and hilar structures normal. Lungs are clear. No pleural effusion or pneumothorax. Heart size normal. No acute bony abnormality. Mild thoracic spine scoliosis. IMPRESSION: No acute cardiopulmonary disease. Electronically Signed   By: Maisie Fushomas  Register   On: 04/10/2018 11:37    Procedures Procedures (including critical care time)  Medications Ordered in ED Medications - No data to display   Initial Impression / Assessment and Plan / ED Course  I have reviewed the triage vital signs and the nursing  notes.  Pertinent labs & imaging results that were available during my care of the patient were reviewed by me and considered in my medical decision making (see chart for details).   Patient presents with URI type symptoms and subjective fever.  Patient nontoxic-appearing, no apparent distress, vitals WNL with the exception of tachycardia with initial vitals, resolved on my exam and with repeat vitals.  Patient's lungs are clear to auscultation bilaterally, chest x-ray negative for infiltrate, doubt pneumonia.  No wheezing on exam.  Rapid strep screen is negative, culture pending.  Symptoms have not been ongoing for more than 7 days, patient is afebrile, no sinus tenderness, doubt acute bacterial sinusitis.  Headaches have had gradual onset, steady progression, similar to previous, no meningeal signs.  Patient has had no known tick exposures, she does not spend time in the woods or outdoors, therefore low suspicion for tickborne illness such as RMSF or Lyme's disease. At this time suspect viral vs. Allergic, will treat supportively with flonase, ibuprofen, and tessalon. Instructed need for hydration with increased fluid intake. I discussed results, treatment plan, need for PCP follow-up, and return precautions with the  patient. Provided opportunity for questions, patient confirmed understanding and is in agreement with plan.   Vitals:   04/10/18 1043 04/10/18 1216  BP: 113/77   Pulse: (!) 115 99  Resp: 18   Temp: 99.3 F (37.4 C)   SpO2: 99%    Final Clinical Impressions(s) / ED Diagnoses   Final diagnoses:  URI with cough and congestion    ED Discharge Orders        Ordered    benzonatate (TESSALON) 100 MG capsule  Every 8 hours     04/10/18 1217    fluticasone (FLONASE) 50 MCG/ACT nasal spray  Daily     04/10/18 1217    ibuprofen (ADVIL,MOTRIN) 800 MG tablet  Every 8 hours PRN     04/10/18 1217       Dorette Hartel, [place] R, PA-C 04/10/18 1450    Pricilla Loveless, MD 04/10/18  1521

## 2018-04-10 NOTE — ED Triage Notes (Addendum)
Cough, sore throat and headache since Thursday.

## 2018-04-10 NOTE — Discharge Instructions (Addendum)
You were seen in the emergency today for sore throat, headaches, cough, and congestion. Your strep test was negative. Your chest x-ray was normal. Your pregnancy test was negative. We suspect this is a viral illness.  I have prescribed you multiple medications to treat your symptoms.   -Flonase to be used 1 spray in each nostril daily.  This medication is used to treat your congestion.  -Tessalon can be taken once every 8 hours as needed.  This medication is used to treat your cough.  -Ibuprofen to be taken once every 8 hours as needed for pain.  We have prescribed you new medication(s) today. Discuss the medications prescribed today with your pharmacist as they can have adverse effects and interactions with your other medicines including over the counter and prescribed medications. Seek medical evaluation if you start to experience new or abnormal symptoms after taking one of these medicines, seek care immediately if you start to experience difficulty breathing, feeling of your throat closing, facial swelling, or rash as these could be indications of a more serious allergic reaction  Be sure to stay well hydrated drinking 8 glasses of water per day.   You will need to follow-up with your primary care provider in 1 week if your symptoms have not improved.  If you do not have a primary care provider one is provided in your discharge instructions.  Return to the emergency department for any new or worsening symptoms including but not limited to persistent fever, inability to move your neck, difficulty breathing, chest pain, passing out, new rashes, or any other concerns.

## 2018-04-13 LAB — CULTURE, GROUP A STREP (THRC)

## 2018-09-18 ENCOUNTER — Emergency Department (HOSPITAL_BASED_OUTPATIENT_CLINIC_OR_DEPARTMENT_OTHER)
Admission: EM | Admit: 2018-09-18 | Discharge: 2018-09-18 | Disposition: A | Discharge status: Home / Self Care | Payer: Medicaid Other | Attending: Emergency Medicine | Admitting: Emergency Medicine

## 2018-09-18 ENCOUNTER — Emergency Department (HOSPITAL_BASED_OUTPATIENT_CLINIC_OR_DEPARTMENT_OTHER): Payer: Medicaid Other

## 2018-09-18 ENCOUNTER — Other Ambulatory Visit: Payer: Self-pay

## 2018-09-18 ENCOUNTER — Encounter (HOSPITAL_BASED_OUTPATIENT_CLINIC_OR_DEPARTMENT_OTHER): Payer: Self-pay | Admitting: *Deleted

## 2018-09-18 DIAGNOSIS — B9789 Other viral agents as the cause of diseases classified elsewhere: Secondary | ICD-10-CM | POA: Insufficient documentation

## 2018-09-18 DIAGNOSIS — F17290 Nicotine dependence, other tobacco product, uncomplicated: Secondary | ICD-10-CM | POA: Diagnosis not present

## 2018-09-18 DIAGNOSIS — Z79899 Other long term (current) drug therapy: Secondary | ICD-10-CM | POA: Diagnosis not present

## 2018-09-18 DIAGNOSIS — J029 Acute pharyngitis, unspecified: Secondary | ICD-10-CM | POA: Diagnosis present

## 2018-09-18 DIAGNOSIS — J069 Acute upper respiratory infection, unspecified: Secondary | ICD-10-CM | POA: Diagnosis not present

## 2018-09-18 LAB — PREGNANCY, URINE: PREG TEST UR: NEGATIVE

## 2018-09-18 LAB — GROUP A STREP BY PCR: GROUP A STREP BY PCR: NOT DETECTED

## 2018-09-18 MED ORDER — LIDOCAINE VISCOUS HCL 2 % MT SOLN: 15.0000 mL | OROMUCOSAL | 0 refills | Status: DC | PRN

## 2018-09-18 MED ORDER — IBUPROFEN 400 MG PO TABS
400.0000 mg | ORAL_TABLET | Freq: Once | ORAL | Status: AC
  Administered 2018-09-18: 400 mg via ORAL
  Filled 2018-09-18: qty 1

## 2018-09-18 NOTE — ED Provider Notes (Signed)
[facility] EMERGENCY DEPARTMENT Provider Note   CSN: [id number] Arrival date & time: 09/18/18  1810     History   Chief Complaint Chief Complaint  Patient presents with  . Sore Throat    HPI Donna Bartlett is a 22 y.o. female without history of chronic medical conditions or daily medication use presenting today for 2-week history of rhinorrhea, congestion, sore throat, generalized body aches and cough.   Patient states that her symptoms began gradually and have become increasingly worse over the past 2 weeks.  Patient states that she has been treating her's symptoms with unknown over-the-counter cough/cold medication with moderate relief.  She describes her cough as an intermittent productive cough with yellow sputum.  Patient denies shortness of breath or chest pain associated with her cough.  Patient describes her sore throat as a bilateral mild burning sensation that is constant and worsened with swallowing.  Patient states that she has been eating and drinking without difficulty, she last ate M&Ms prior to arrival.  Patient denies knowledge of fever prior to arrival, denies abdominal pain, nausea/vomiting, diarrhea, chest pain, shortness of breath, headache, neck pain, visual changes, rash.  HPI  History reviewed. No pertinent past medical history.  Patient Active Problem List   Diagnosis Date Noted  . Injury of right toe, initial encounter 08/25/2017    Past Surgical History:  Procedure Laterality Date  . WRIST SURGERY       OB History   None      Home Medications    Prior to Admission medications   Medication Sig Start Date End Date Taking? Authorizing Provider  benzonatate (TESSALON) 100 MG capsule Take 1 capsule (100 mg total) by mouth every 8 (eight) hours. 04/10/18   Petrucelli, Samantha R, PA-C  fluticasone (FLONASE) 50 MCG/ACT nasal spray Place 1 spray into both nostrils daily. 04/10/18   Petrucelli, Samantha R, PA-C  ibuprofen (ADVIL,MOTRIN) 800  MG tablet Take 1 tablet (800 mg total) by mouth every 8 (eight) hours as needed. 04/10/18   Petrucelli, Samantha R, PA-C  lidocaine (XYLOCAINE) 2 % solution Use as directed 15 mLs in the mouth or throat as needed for mouth pain (Do not swallow). 09/18/18   Harlene Salts A, PA-C  ranitidine (ZANTAC) 150 MG tablet Take 1 tablet (150 mg total) by mouth 2 (two) times daily. 02/25/17 03/04/17  Shaune Pollack, MD  sucralfate (CARAFATE) 1 GM/10ML suspension Take 10 mLs (1 g total) by mouth 4 (four) times daily -  with meals and at bedtime. 02/25/17 03/04/17  Shaune Pollack, MD    Family History No family history on file.  Social History Social History   Tobacco Use  . Smoking status: Current Every Day Smoker    Types: Cigars  . Smokeless tobacco: Never Used  Substance Use Topics  . Alcohol use: No  . Drug use: Yes    Frequency: 5.0 times per week    Types: Marijuana    Comment: last use 1 month ago (12/2017)     Allergies   Other   Review of Systems Review of Systems  Constitutional: Positive for fever. Negative for activity change, appetite change and chills.  HENT: Positive for congestion, rhinorrhea and sore throat. Negative for drooling, facial swelling, trouble swallowing and voice change.   Eyes: Negative.  Negative for visual disturbance.  Respiratory: Positive for cough. Negative for shortness of breath.   Cardiovascular: Negative.  Negative for chest pain and leg swelling.  Gastrointestinal: Negative.  Negative for abdominal pain,  blood in stool, diarrhea, nausea and vomiting.  Genitourinary: Negative.  Negative for dysuria and hematuria.  Musculoskeletal: Positive for arthralgias and myalgias. Negative for neck pain.  Skin: Negative.  Negative for rash.  Neurological: Negative.  Negative for dizziness, weakness and headaches.   Physical Exam Updated Vital Signs BP 103/63 (BP Location: Right Arm)   Pulse (!) 112   Temp (!) 100.6 F (38.1 C) (Oral)   Resp 20   Ht 5\' 5"   (1.651 m)   Wt 73.5 kg   LMP 09/11/2018   SpO2 100%   BMI 26.96 kg/m   Physical Exam  Constitutional: She appears well-developed and well-nourished.  Non-toxic appearance. She does not appear ill. No distress.  HENT:  Head: Normocephalic and atraumatic.  Right Ear: Hearing, tympanic membrane, external ear and ear canal normal.  Left Ear: Hearing, tympanic membrane, external ear and ear canal normal.  Nose: Mucosal edema and rhinorrhea present.  Mouth/Throat: Uvula is midline, oropharynx is clear and moist and mucous membranes are normal. Tonsils are 1+ on the right. Tonsils are 1+ on the left. No tonsillar exudate.  The patient has normal phonation and is in control of secretions. No stridor.  Midline uvula without edema. Soft palate rises symmetrically. Mild tonsillar erythema; no tonsillar swelling or exudates. Tongue protrusion is normal, floor of mouth is soft. No trismus. No creptius on neck palpation. No gingival erythema or fluctuance noted. Mucus membranes moist. No pallor noted.  Eyes: Pupils are equal, round, and reactive to light. Conjunctivae and EOM are normal.  Neck: Trachea normal, normal range of motion, full passive range of motion without pain and phonation normal. Neck supple. No tracheal tenderness present. No neck rigidity. No tracheal deviation, no edema and no erythema present.  Cardiovascular:  Pulses:      Dorsalis pedis pulses are 2+ on the right side, and 2+ on the left side.       Posterior tibial pulses are 2+ on the right side, and 2+ on the left side.  Pulmonary/Chest: Effort normal and breath sounds normal. No accessory muscle usage. No respiratory distress. She has no decreased breath sounds. She has no rhonchi. She exhibits no tenderness, no crepitus and no deformity.  Abdominal: Soft. There is no tenderness. There is no rigidity, no rebound and no guarding.  Musculoskeletal: Normal range of motion.       Right lower leg: Normal.       Left lower leg:  Normal.  Neurological: She is alert. GCS eye subscore is 4. GCS verbal subscore is 5. GCS motor subscore is 6.  Speech is clear and goal oriented, follows commands Major Cranial nerves without deficit, no facial droop Normal strength in upper and lower extremities bilaterally including dorsiflexion and plantar flexion, strong and equal grip strength Sensation normal to light touch Moves extremities without ataxia, coordination intact Normal gait  Skin: Skin is warm and dry.  Psychiatric: She has a normal mood and affect. Her behavior is normal.   ED Treatments / Results  Labs (all labs ordered are listed, but only abnormal results are displayed) Labs Reviewed  GROUP A STREP BY PCR  PREGNANCY, URINE    EKG None  Radiology Dg Chest 2 View  Result Date: 09/18/2018 CLINICAL DATA:  Cough. EXAM: CHEST - 2 VIEW COMPARISON:  April 10, 2018 FINDINGS: The heart size and mediastinal contours are within normal limits. Both lungs are clear. The visualized skeletal structures are unremarkable. IMPRESSION: No active cardiopulmonary disease. Electronically Signed   By:  Gerome Samavid  Williams III M.D   On: 09/18/2018 19:22    Procedures Procedures (including critical care time)  Medications Ordered in ED Medications  ibuprofen (ADVIL,MOTRIN) tablet 400 mg (400 mg Oral Given 09/18/18 1824)     Initial Impression / Assessment and Plan / ED Course  I have reviewed the triage vital signs and the nursing notes.  Pertinent labs & imaging results that were available during my care of the patient were reviewed by me and considered in my medical decision making (see chart for details).  Clinical Course as of Sep 19 2007  Tue Sep 18, 2018  [id number] Discussed with Dr. Jacqulyn BathLong, agrees with discharge and symptomatic treatment at this time.   [BM]  2002 Patient reevaluated resting comfortably no acute distress.  Patient states that her symptoms have improved since receiving the Motrin earlier this visit.  Patient  is requesting discharge at this time.   [BM]    Clinical Course User Index [BM] Bill SalinasMorelli, Charma Mocarski A, PA-C    22 year old otherwise healthy female presenting today for URI-like symptoms.  Patient's CXR is negative for acute infiltrate.  Strep test negative.  Symptoms are likely of viral etiology. Discussed that antibiotics are not indicated for viral infections.  Patient with mild cervical lymphadenopathy & dysphagia; diagnosis of viral pharyngitis. No antibiotics indicated at this time.  Patient does not appear dehydrated, but did discuss importance of water rehydration. Presentation non-concerning for peritonsillar abscess, retropharyngeal abscess, Ludwig's angina or other deep tissue infections of the head/neck. Patient is without trismus or uvula deviation.  Patient is able to drink water in ED without difficulty with intact air way.  Patient noted to be febrile and tachycardic here in emergency department.  Patient was given 400 mg Motrin by nursing staff fever has begun resolving and tachycardia has improved.  Patient states that her pain has resolved following Motrin.  Patient is overall very well-appearing, nontoxic and in no acute distress.  Patient is requesting discharge.  Suspect viral illness at this time, do not suspect sepsis or bacterial etiology of symptoms.  I have prescribed the patient viscous lidocaine for symptomatic treatment of her sore throat.  I have encouraged Tylenol/ibuprofen for symptomatic treatment including fever.  I encouraged patient to follow-up with her primary care provider within 72 hours regarding her visit today.  Encouraged patient to return to emergency department if she does not feel improved within 72 hours or for any new or worsening symptoms.  Patient will be discharged with symptomatic treatment. Patient verbalizes understanding and is agreeable with plan. Patient is hemodynamically stable and in no acute distress prior to discharge.  At this time there does  not appear to be any evidence of an acute emergency medical condition and the patient appears stable for discharge with appropriate outpatient follow up. Diagnosis was discussed with patient who verbalizes understanding of care plan and is agreeable to discharge. I have discussed return precautions with patient who verbalizes understanding of return precautions. Patient strongly encouraged to follow-up with their PCP. All questions answered.  Patient's case discussed with Dr. Jacqulyn BathLong who agrees with plan to discharge with follow-up and symptomatic treatment.     Note: Portions of this report may have been transcribed using voice recognition software. Every effort was made to ensure accuracy; however, inadvertent computerized transcription errors may still be present. Final Clinical Impressions(s) / ED Diagnoses   Final diagnoses:  Viral URI with cough    ED Discharge Orders  Ordered    lidocaine (XYLOCAINE) 2 % solution  As needed     09/18/18 2004           Elizabeth Palau 09/19/18 0038    Maia Plan, MD 09/19/18 [phone]

## 2018-09-18 NOTE — Discharge Instructions (Addendum)
You have been diagnosed today with viral upper respiratory tract infection with cough. At this time there does not appear to be the presence of an emergent medical condition, however there is always the potential for conditions to change. Please read and follow the below instructions.  Please return to the Emergency Department immediately for any new or worsening symptoms or if your symptoms do not improve in 72 hours. Please be sure to follow up with your Primary Care Provider within 72 hours regarding your visit today; please call their office to schedule an appointment even if you are feeling better for a follow-up visit. You may use the viscous lidocaine mouth rinse as prescribed to help with your sore throat.  Do not swallow this medication. Please take Ibuprofen (Advil, motrin) and Tylenol (acetaminophen) to relieve your pain.  You may take up to 400 MG (2 pills) of normal strength ibuprofen every 8 hours as needed.  In between doses of ibuprofen you make take tylenol, up to 500 mg (one extra strength pill).  Do not take more than 3,000 mg tylenol in a 24 hour period.  Please check all medication labels as many medications such as pain and cold medications may contain tylenol.  Do not drink alcohol while taking these medications.  Do not take other NSAID'S while taking ibuprofen (such as aleve or naproxen).  Please take ibuprofen with food to decrease stomach upset. Drink plenty of water and get plenty of rest to help with your symptoms.  Get help right away if: You have very bad or constant: Headache. Ear pain. Pain in your forehead, behind your eyes, and over your cheekbones (sinus pain). Chest pain. You have long-lasting (chronic) lung disease and any of the following: Wheezing. Long-lasting cough. Coughing up blood. A change in your usual mucus. You have a stiff neck. You have changes in your: Vision. Hearing. Thinking. Mood. Get help right away if: You cough up blood. You have  difficulty breathing. Your heartbeat is very fast. You cannot swallow You start drooling  Please read the additional information packets attached to your discharge summary.  Do not take your medicine if  develop an itchy rash, swelling in your mouth or lips, or difficulty breathing.

## 2018-09-18 NOTE — ED Notes (Signed)
Pt asking how many times we're going to check her vital signs, informed her that the doctor just ordered this and we want to make sure her temp has come down. Pt asking what temp she has to have in order to leave, informed her that there is not a magic number, but that nurse would send provider in.

## 2018-09-18 NOTE — ED Triage Notes (Signed)
Sore throat x 2 weeks

## 2018-10-17 ENCOUNTER — Emergency Department (HOSPITAL_BASED_OUTPATIENT_CLINIC_OR_DEPARTMENT_OTHER)
Admission: EM | Admit: 2018-10-17 | Discharge: 2018-10-17 | Disposition: A | Discharge status: Home / Self Care | Payer: Medicaid Other | Attending: Emergency Medicine | Admitting: Emergency Medicine

## 2018-10-17 ENCOUNTER — Other Ambulatory Visit: Payer: Self-pay

## 2018-10-17 ENCOUNTER — Encounter (HOSPITAL_BASED_OUTPATIENT_CLINIC_OR_DEPARTMENT_OTHER): Payer: Self-pay

## 2018-10-17 DIAGNOSIS — F17290 Nicotine dependence, other tobacco product, uncomplicated: Secondary | ICD-10-CM | POA: Diagnosis not present

## 2018-10-17 DIAGNOSIS — B9689 Other specified bacterial agents as the cause of diseases classified elsewhere: Secondary | ICD-10-CM | POA: Insufficient documentation

## 2018-10-17 DIAGNOSIS — Z3202 Encounter for pregnancy test, result negative: Secondary | ICD-10-CM | POA: Insufficient documentation

## 2018-10-17 DIAGNOSIS — N760 Acute vaginitis: Secondary | ICD-10-CM | POA: Insufficient documentation

## 2018-10-17 DIAGNOSIS — Z79899 Other long term (current) drug therapy: Secondary | ICD-10-CM | POA: Insufficient documentation

## 2018-10-17 DIAGNOSIS — N898 Other specified noninflammatory disorders of vagina: Secondary | ICD-10-CM | POA: Diagnosis present

## 2018-10-17 LAB — WET PREP, GENITAL
Sperm: NONE SEEN
Trich, Wet Prep: NONE SEEN
YEAST WET PREP: NONE SEEN

## 2018-10-17 LAB — URINALYSIS, ROUTINE W REFLEX MICROSCOPIC
BILIRUBIN URINE: NEGATIVE
Glucose, UA: NEGATIVE mg/dL
Hgb urine dipstick: NEGATIVE
Ketones, ur: NEGATIVE mg/dL
LEUKOCYTES UA: NEGATIVE
NITRITE: NEGATIVE
Protein, ur: NEGATIVE mg/dL
SPECIFIC GRAVITY, URINE: 1.015 (ref 1.005–1.030)
pH: 8.5 — ABNORMAL HIGH (ref 5.0–8.0)

## 2018-10-17 LAB — PREGNANCY, URINE: PREG TEST UR: NEGATIVE

## 2018-10-17 MED ORDER — METRONIDAZOLE 500 MG PO TABS: 500.0000 mg | ORAL_TABLET | Freq: Two times a day (BID) | ORAL | 0 refills | Status: DC

## 2018-10-17 NOTE — ED Provider Notes (Signed)
[facility] EMERGENCY DEPARTMENT Provider Note   CSN: [id number] Arrival date & time: 10/17/18  1317     History   Chief Complaint Chief Complaint  Patient presents with  . Abdominal Pain    HPI Donna Bartlett is a 22 y.o. female.  22 year old female presents requesting pregnancy test, states her menstrual cycle is 4 days late and her nipples are tender and she is nauseated.  Also reports increase in vaginal discharge with odor.  Denies pelvic pain.  Patient is sexually active, does not use birth control.  No other complaints or concerns.     History reviewed. No pertinent past medical history.  Patient Active Problem List   Diagnosis Date Noted  . Injury of right toe, initial encounter 08/25/2017    Past Surgical History:  Procedure Laterality Date  . WRIST SURGERY       OB History   No obstetric history on file.      Home Medications    Prior to Admission medications   Medication Sig Start Date End Date Taking? Authorizing Provider  benzonatate (TESSALON) 100 MG capsule Take 1 capsule (100 mg total) by mouth every 8 (eight) hours. 04/10/18   Petrucelli, Samantha R, PA-C  fluticasone (FLONASE) 50 MCG/ACT nasal spray Place 1 spray into both nostrils daily. 04/10/18   Petrucelli, Samantha R, PA-C  ibuprofen (ADVIL,MOTRIN) 800 MG tablet Take 1 tablet (800 mg total) by mouth every 8 (eight) hours as needed. 04/10/18   Petrucelli, Samantha R, PA-C  lidocaine (XYLOCAINE) 2 % solution Use as directed 15 mLs in the mouth or throat as needed for mouth pain (Do not swallow). 09/18/18   Harlene SaltsMorelli, Brandon A, PA-C  metroNIDAZOLE (FLAGYL) 500 MG tablet Take 1 tablet (500 mg total) by mouth 2 (two) times daily. 10/17/18   Jeannie FendMurphy,  A, PA-C  ranitidine (ZANTAC) 150 MG tablet Take 1 tablet (150 mg total) by mouth 2 (two) times daily. 02/25/17 03/04/17  Shaune PollackIsaacs, Cameron, MD  sucralfate (CARAFATE) 1 GM/10ML suspension Take 10 mLs (1 g total) by mouth 4 (four) times daily -   with meals and at bedtime. 02/25/17 03/04/17  Shaune PollackIsaacs, Cameron, MD    Family History No family history on file.  Social History Social History   Tobacco Use  . Smoking status: Current Every Day Smoker    Types: Cigars  . Smokeless tobacco: Never Used  Substance Use Topics  . Alcohol use: No  . Drug use: Not Currently    Types: Marijuana     Allergies   Other   Review of Systems Review of Systems  Constitutional: Negative for fever.  Gastrointestinal: Positive for nausea. Negative for constipation, diarrhea and vomiting.  Genitourinary: Positive for vaginal discharge. Negative for dysuria, frequency, pelvic pain, urgency, vaginal bleeding and vaginal pain.  Musculoskeletal: Negative for arthralgias and myalgias.  Skin: Negative for rash and wound.  Allergic/Immunologic: Negative for immunocompromised state.  Hematological: Negative for adenopathy.  Psychiatric/Behavioral: Negative for confusion.  All other systems reviewed and are negative.    Physical Exam Updated Vital Signs BP 115/80 (BP Location: Right Arm)   Pulse 95   Temp 99 F (37.2 C) (Oral)   Resp 18   Ht 5\' 5"  (1.651 m)   Wt 73.2 kg   LMP 09/13/2018   SpO2 100%   BMI 26.84 kg/m   Physical Exam Vitals signs and nursing note reviewed. Exam conducted with a chaperone present.  Constitutional:      General: She is not in acute  distress.    Appearance: She is well-developed. She is not ill-appearing, toxic-appearing or diaphoretic.  HENT:     Head: Normocephalic and atraumatic.  Pulmonary:     Effort: Pulmonary effort is normal.  Abdominal:     General: Abdomen is flat.     Palpations: Abdomen is soft.     Tenderness: There is no abdominal tenderness.  Genitourinary:    Vagina: No signs of injury and foreign body. No erythema, tenderness or bleeding.     Cervix: Discharge present. No friability.  Skin:    General: Skin is warm and dry.  Neurological:     Mental Status: She is alert and oriented  to person, place, and time.  Psychiatric:        Behavior: Behavior normal.      ED Treatments / Results  Labs (all labs ordered are listed, but only abnormal results are displayed) Labs Reviewed  WET PREP, GENITAL - Abnormal; Notable for the following components:      Result Value   Clue Cells Wet Prep HPF POC PRESENT (*)    WBC, Wet Prep HPF POC MODERATE (*)    All other components within normal limits  URINALYSIS, ROUTINE W REFLEX MICROSCOPIC - Abnormal; Notable for the following components:   pH 8.5 (*)    All other components within normal limits  PREGNANCY, URINE  GC/CHLAMYDIA PROBE AMP () NOT AT [facility]    EKG None  Radiology No results found.  Procedures Procedures (including critical care time)  Medications Ordered in ED Medications - No data to display   Initial Impression / Assessment and Plan / ED Course  I have reviewed the triage vital signs and the nursing notes.  Pertinent labs & imaging results that were available during my care of the patient were reviewed by me and considered in my medical decision making (see chart for details).  Clinical Course as of Oct 17 1518  Wed Oct 17, 2018  756 22 year old female presents requesting pregnancy test and evaluation for vaginal discharge.  Pregnancy test is negative, wet prep is positive for clue cells and patient will be treated with Flagyl.  Advised patient to repeat home pregnancy test in 1 week if she has not started her menstrual cycle and follow-up with her primary care provider.   [LM]    Clinical Course User Index [LM] Jeannie Fend, PA-C   Final Clinical Impressions(s) / ED Diagnoses   Final diagnoses:  Bacterial vaginosis  Pregnancy test negative    ED Discharge Orders         Ordered    metroNIDAZOLE (FLAGYL) 500 MG tablet  2 times daily     10/17/18 1518           Jeannie Fend, PA-C 10/17/18 1519    Little, Ambrose Finland, MD 10/17/18 1556

## 2018-10-17 NOTE — ED Triage Notes (Signed)
C/o abd pain x 3-4 days-requesting preg test-LMP 11/14-NAD-steady gait

## 2018-10-17 NOTE — Discharge Instructions (Signed)
Take Flagyl as prescribed and complete the full course.  Do not drink alcohol while taking Flagyl. Take a home pregnancy test in 1 week if your menstrual cycle has not started.  Follow-up with your primary care provider.

## 2018-10-17 NOTE — ED Notes (Signed)
NAD at this time. Pt is stable and going home.  

## 2018-10-18 LAB — GC/CHLAMYDIA PROBE AMP (~~LOC~~) NOT AT ARMC
CHLAMYDIA, DNA PROBE: NEGATIVE
Neisseria Gonorrhea: NEGATIVE

## 2018-10-20 ENCOUNTER — Encounter (HOSPITAL_BASED_OUTPATIENT_CLINIC_OR_DEPARTMENT_OTHER): Payer: Self-pay | Admitting: Emergency Medicine

## 2018-10-20 ENCOUNTER — Emergency Department (HOSPITAL_BASED_OUTPATIENT_CLINIC_OR_DEPARTMENT_OTHER)
Admission: EM | Admit: 2018-10-20 | Discharge: 2018-10-20 | Disposition: A | Discharge status: Home / Self Care | Payer: Medicaid Other | Attending: Emergency Medicine | Admitting: Emergency Medicine

## 2018-10-20 ENCOUNTER — Other Ambulatory Visit: Payer: Self-pay

## 2018-10-20 DIAGNOSIS — F17200 Nicotine dependence, unspecified, uncomplicated: Secondary | ICD-10-CM | POA: Insufficient documentation

## 2018-10-20 DIAGNOSIS — Z79899 Other long term (current) drug therapy: Secondary | ICD-10-CM | POA: Insufficient documentation

## 2018-10-20 DIAGNOSIS — N939 Abnormal uterine and vaginal bleeding, unspecified: Secondary | ICD-10-CM | POA: Insufficient documentation

## 2018-10-20 NOTE — ED Triage Notes (Signed)
Patient states that she came in wed because her period was 1 week late. She reports that she now has her period starting yesterday and it is heavier than usual

## 2018-10-20 NOTE — ED Notes (Signed)
Pt refused to get into gown for exam and requested to leave.

## 2018-10-20 NOTE — ED Provider Notes (Signed)
[facility] EMERGENCY DEPARTMENT Provider Note   CSN: [id number] Arrival date & time: 10/20/18  1847     History   Chief Complaint Chief Complaint  Patient presents with  . Vaginal Bleeding    HPI Donna Bartlett is a 22 y.o. female.  Pt presents to the ED today with heavy vaginal bleeding.  Pt was seen in the ED on 12/18 because her period was late.  She was not pregnant, but did have BV.  She said her period started a few days ago and it is heavier than usual.  She denies any pain.     History reviewed. No pertinent past medical history.  Patient Active Problem List   Diagnosis Date Noted  . Injury of right toe, initial encounter 08/25/2017    Past Surgical History:  Procedure Laterality Date  . WRIST SURGERY       OB History   No obstetric history on file.      Home Medications    Prior to Admission medications   Medication Sig Start Date End Date Taking? Authorizing Provider  benzonatate (TESSALON) 100 MG capsule Take 1 capsule (100 mg total) by mouth every 8 (eight) hours. 04/10/18   Petrucelli, Samantha R, PA-C  fluticasone (FLONASE) 50 MCG/ACT nasal spray Place 1 spray into both nostrils daily. 04/10/18   Petrucelli, Samantha R, PA-C  ibuprofen (ADVIL,MOTRIN) 800 MG tablet Take 1 tablet (800 mg total) by mouth every 8 (eight) hours as needed. 04/10/18   Petrucelli, Samantha R, PA-C  lidocaine (XYLOCAINE) 2 % solution Use as directed 15 mLs in the mouth or throat as needed for mouth pain (Do not swallow). 09/18/18   Harlene SaltsMorelli, Brandon A, PA-C  metroNIDAZOLE (FLAGYL) 500 MG tablet Take 1 tablet (500 mg total) by mouth 2 (two) times daily. 10/17/18   Jeannie FendMurphy, Laura A, PA-C  ranitidine (ZANTAC) 150 MG tablet Take 1 tablet (150 mg total) by mouth 2 (two) times daily. 02/25/17 03/04/17  Shaune PollackIsaacs, Cameron, MD  sucralfate (CARAFATE) 1 GM/10ML suspension Take 10 mLs (1 g total) by mouth 4 (four) times daily -  with meals and at bedtime. 02/25/17 03/04/17  Shaune PollackIsaacs, Cameron,  MD    Family History History reviewed. No pertinent family history.  Social History Social History   Tobacco Use  . Smoking status: Current Every Day Smoker    Types: Cigars  . Smokeless tobacco: Never Used  Substance Use Topics  . Alcohol use: No  . Drug use: Not Currently    Types: Marijuana     Allergies   Other   Review of Systems Review of Systems  Genitourinary: Positive for vaginal bleeding.  All other systems reviewed and are negative.    Physical Exam Updated Vital Signs BP 109/70 (BP Location: Left Arm)   Pulse 81   Temp 97.8 F (36.6 C) (Oral)   Resp 18   Ht 5\' 5"  (1.651 m)   Wt 73.2 kg   LMP 10/20/2018   SpO2 100%   BMI 26.85 kg/m   Physical Exam Vitals signs and nursing note reviewed.  Constitutional:      Appearance: Normal appearance.  HENT:     Head: Normocephalic and atraumatic.     Right Ear: External ear normal.     Left Ear: External ear normal.     Nose: Nose normal.     Mouth/Throat:     Mouth: Mucous membranes are moist.  Eyes:     Pupils: Pupils are equal, round, and reactive to light.  Neck:     Musculoskeletal: Normal range of motion.  Cardiovascular:     Rate and Rhythm: Normal rate and regular rhythm.     Pulses: Normal pulses.  Pulmonary:     Effort: Pulmonary effort is normal.     Breath sounds: Normal breath sounds.  Abdominal:     General: Abdomen is flat. Bowel sounds are normal.     Palpations: Abdomen is soft.     Tenderness: There is no abdominal tenderness.  Musculoskeletal: Normal range of motion.  Skin:    Capillary Refill: Capillary refill takes less than 2 seconds.  Neurological:     General: No focal deficit present.     Mental Status: She is alert.      ED Treatments / Results  Labs (all labs ordered are listed, but only abnormal results are displayed) Labs Reviewed - No data to display  EKG None  Radiology No results found.  Procedures Procedures (including critical care  time)  Medications Ordered in ED Medications - No data to display   Initial Impression / Assessment and Plan / ED Course  I have reviewed the triage vital signs and the nursing notes.  Pertinent labs & imaging results that were available during my care of the patient were reviewed by me and considered in my medical decision making (see chart for details).    Pt refused to get undressed.  She refused blood or urine.  She refused pelvic.  I am not sure why she came in if she does not want anything done.  She understands that I can't rule out serious etiologies without checking blood work, pregnancy status, pelvic.  She is given the number of women's clinic to f/u.  She is welcome to return at any time if she changes her mind.  Return if worse.  Final Clinical Impressions(s) / ED Diagnoses   Final diagnoses:  Vaginal bleeding    ED Discharge Orders    None       Jacalyn LefevreHaviland, Cyd Hostler, MD 10/20/18 1931

## 2018-11-03 ENCOUNTER — Emergency Department (HOSPITAL_BASED_OUTPATIENT_CLINIC_OR_DEPARTMENT_OTHER)
Admission: EM | Admit: 2018-11-03 | Discharge: 2018-11-03 | Disposition: A | Discharge status: Home / Self Care | Payer: Medicaid Other | Attending: Emergency Medicine | Admitting: Emergency Medicine

## 2018-11-03 ENCOUNTER — Encounter (HOSPITAL_BASED_OUTPATIENT_CLINIC_OR_DEPARTMENT_OTHER): Payer: Self-pay | Admitting: Emergency Medicine

## 2018-11-03 ENCOUNTER — Other Ambulatory Visit: Payer: Self-pay

## 2018-11-03 DIAGNOSIS — R079 Chest pain, unspecified: Secondary | ICD-10-CM | POA: Diagnosis not present

## 2018-11-03 DIAGNOSIS — Z5321 Procedure and treatment not carried out due to patient leaving prior to being seen by health care provider: Secondary | ICD-10-CM | POA: Diagnosis not present

## 2018-11-03 NOTE — ED Triage Notes (Signed)
Patient states that she has had intermittent chest pain since yesterday - " it comes and goes" the patient denies any at this time . The patient also reports " I wanna get checked" - patient states that she is having discharge

## 2018-11-03 NOTE — ED Notes (Signed)
Pt informed registration she was leaving  

## 2018-11-04 ENCOUNTER — Emergency Department (HOSPITAL_BASED_OUTPATIENT_CLINIC_OR_DEPARTMENT_OTHER)
Admission: EM | Admit: 2018-11-04 | Discharge: 2018-11-05 | Disposition: A | Discharge status: Home / Self Care | Payer: Medicaid Other | Attending: Emergency Medicine | Admitting: Emergency Medicine

## 2018-11-04 ENCOUNTER — Encounter (HOSPITAL_BASED_OUTPATIENT_CLINIC_OR_DEPARTMENT_OTHER): Payer: Self-pay | Admitting: *Deleted

## 2018-11-04 ENCOUNTER — Other Ambulatory Visit: Payer: Self-pay

## 2018-11-04 DIAGNOSIS — F17210 Nicotine dependence, cigarettes, uncomplicated: Secondary | ICD-10-CM | POA: Diagnosis not present

## 2018-11-04 DIAGNOSIS — N898 Other specified noninflammatory disorders of vagina: Secondary | ICD-10-CM | POA: Diagnosis present

## 2018-11-04 DIAGNOSIS — Z79899 Other long term (current) drug therapy: Secondary | ICD-10-CM | POA: Insufficient documentation

## 2018-11-04 LAB — URINALYSIS, ROUTINE W REFLEX MICROSCOPIC
Bilirubin Urine: NEGATIVE
GLUCOSE, UA: NEGATIVE mg/dL
HGB URINE DIPSTICK: NEGATIVE
Ketones, ur: NEGATIVE mg/dL
Leukocytes, UA: NEGATIVE
Nitrite: NEGATIVE
PROTEIN: NEGATIVE mg/dL
Specific Gravity, Urine: 1.01 (ref 1.005–1.030)
pH: 8 (ref 5.0–8.0)

## 2018-11-04 LAB — WET PREP, GENITAL
Clue Cells Wet Prep HPF POC: NONE SEEN
Sperm: NONE SEEN
Trich, Wet Prep: NONE SEEN
Yeast Wet Prep HPF POC: NONE SEEN

## 2018-11-04 LAB — PREGNANCY, URINE: PREG TEST UR: NEGATIVE

## 2018-11-04 MED ORDER — FLUCONAZOLE 50 MG PO TABS
150.0000 mg | ORAL_TABLET | Freq: Once | ORAL | Status: AC
  Administered 2018-11-04: 150 mg via ORAL
  Filled 2018-11-04: qty 1

## 2018-11-04 MED ORDER — FLUCONAZOLE 150 MG PO TABS: 150.0000 mg | ORAL_TABLET | Freq: Every day | ORAL | 0 refills | Status: AC

## 2018-11-04 MED ORDER — NYSTATIN 100000 UNIT/GM EX CREA: TOPICAL_CREAM | CUTANEOUS | 0 refills | Status: DC

## 2018-11-04 NOTE — Discharge Instructions (Addendum)
Use medication as directed. If you still have symptoms in 72 hours then take the second dose of fluconazole.   You have been tested for chlamydia and gonorrhea.  These results will be available in approximately 3 days and you will be contacted by the hospital if the results are positive. Avoid sexual contact until you are aware of the results, and please inform all sexual partners if you test positive for any of these diseases.  Please follow up with your primary care provider within 5-7 days for re-evaluation of your symptoms. If you do not have a primary care provider, information for a healthcare clinic has been provided for you to make arrangements for follow up care. Please return to the emergency department for any new or worsening symptoms.

## 2018-11-04 NOTE — ED Triage Notes (Signed)
Vaginal discharge x 3 days. Was here yesterday but left due to long wait

## 2018-11-04 NOTE — ED Provider Notes (Signed)
[facility] EMERGENCY DEPARTMENT Provider Note   CSN: [id number] Arrival date & time: 11/04/18  2211     History   Chief Complaint Chief Complaint  Patient presents with  . Vaginal Discharge    HPI Donna Bartlett is a 23 y.o. female.  HPI   Patient is a 23 year old female who presents the emergency department today for evaluation of vaginal discharge and irritation that has been present for the last 3 days.  Patient states that she was recently diagnosed with bacterial vaginosis and finished a course of antibiotics.  After finishing the course of antibiotics she began to have the discharge and irritation.  She denies that she has had intercourse since she was diagnosed with BV.  She states that her STI testing at that time was negative however she is concerned for STD today and would like to be checked for that.  No urinary symptoms.  No abdominal pain, nausea vomiting diarrhea.  No fevers or chills.  She is never had a yeast infection before.  She is tried no interventions for her symptoms.  Symptoms have been constant since onset.  Denies exacerbating or alleviating factors.  History reviewed. No pertinent past medical history.  Patient Active Problem List   Diagnosis Date Noted  . Injury of right toe, initial encounter 08/25/2017    Past Surgical History:  Procedure Laterality Date  . WRIST SURGERY       OB History   No obstetric history on file.      Home Medications    Prior to Admission medications   Medication Sig Start Date End Date Taking? Authorizing Provider  benzonatate (TESSALON) 100 MG capsule Take 1 capsule (100 mg total) by mouth every 8 (eight) hours. 04/10/18   Petrucelli, Samantha R, PA-C  fluconazole (DIFLUCAN) 150 MG tablet Take 1 tablet (150 mg total) by mouth daily for 1 day. Take one dose in 72 hours if you still have symptoms. 11/04/18 11/05/18  Nastasia Kage S, PA-C  fluticasone (FLONASE) 50 MCG/ACT nasal spray Place 1 spray into both  nostrils daily. 04/10/18   Petrucelli, Samantha R, PA-C  ibuprofen (ADVIL,MOTRIN) 800 MG tablet Take 1 tablet (800 mg total) by mouth every 8 (eight) hours as needed. 04/10/18   Petrucelli, Samantha R, PA-C  lidocaine (XYLOCAINE) 2 % solution Use as directed 15 mLs in the mouth or throat as needed for mouth pain (Do not swallow). 09/18/18   Harlene SaltsMorelli, Brandon A, PA-C  metroNIDAZOLE (FLAGYL) 500 MG tablet Take 1 tablet (500 mg total) by mouth 2 (two) times daily. 10/17/18   Jeannie FendMurphy, Laura A, PA-C  nystatin cream (MYCOSTATIN) Apply to affected area 2 times daily 11/04/18   [place] Devine S, PA-C  ranitidine (ZANTAC) 150 MG tablet Take 1 tablet (150 mg total) by mouth 2 (two) times daily. 02/25/17 03/04/17  Shaune PollackIsaacs, Cameron, MD  sucralfate (CARAFATE) 1 GM/10ML suspension Take 10 mLs (1 g total) by mouth 4 (four) times daily -  with meals and at bedtime. 02/25/17 03/04/17  Shaune PollackIsaacs, Cameron, MD    Family History No family history on file.  Social History Social History   Tobacco Use  . Smoking status: Current Every Day Smoker    Types: Cigars  . Smokeless tobacco: Never Used  Substance Use Topics  . Alcohol use: Yes    Comment: rare  . Drug use: Not Currently    Types: Marijuana     Allergies   Other   Review of Systems Review of Systems  Constitutional:  Negative for fever.  HENT: Negative for sore throat.   Eyes: Negative for visual disturbance.  Respiratory: Negative for cough and shortness of breath.   Cardiovascular: Negative for chest pain.  Gastrointestinal: Negative for abdominal pain, constipation, diarrhea, nausea and vomiting.  Genitourinary: Positive for vaginal discharge. Negative for decreased urine volume, dysuria, frequency, hematuria, urgency, vaginal bleeding and vaginal pain.  Musculoskeletal: Negative for back pain.  Skin: Negative for rash.  Neurological: Negative for headaches.  All other systems reviewed and are negative.    Physical Exam Updated Vital Signs BP  108/68 (BP Location: Left Arm)   Pulse 93   Temp 98.2 F (36.8 C) (Oral)   Resp 18   Ht 5\' 5"  (1.651 m)   Wt 73.4 kg   LMP 10/20/2018   SpO2 100%   BMI 26.93 kg/m   Physical Exam Vitals signs and nursing note reviewed.  Constitutional:      General: She is not in acute distress.    Appearance: She is well-developed.  HENT:     Head: Normocephalic and atraumatic.  Eyes:     Conjunctiva/sclera: Conjunctivae normal.  Neck:     Musculoskeletal: Neck supple.  Cardiovascular:     Rate and Rhythm: Normal rate and regular rhythm.     Heart sounds: No murmur.  Pulmonary:     Effort: Pulmonary effort is normal. No respiratory distress.     Breath sounds: Normal breath sounds.  Abdominal:     Palpations: Abdomen is soft.     Tenderness: There is no abdominal tenderness. There is no right CVA tenderness, left CVA tenderness, guarding or rebound.  Genitourinary:    Comments: Exam performed by Karrie Meresortni S Tayona Sarnowski,  exam chaperoned Date: 11/04/2018 Pelvic exam: normal external genitalia without evidence of trauma. VULVA: normal appearing vulva with no masses, tenderness or lesion. VAGINA: normal appearing vagina with normal color.  Thick white discharge consistent with yeast present. CERVIX: normal appearing cervix without lesions, cervical motion tenderness absent, cervical os closed with out purulent discharge; vaginal discharge -present, thick chunky white discharge present consistent with yeast, Wet prep and DNA probe for chlamydia and GC obtained.   ADNEXA: normal adnexa in size, nontender and no masses UTERUS: uterus is normal size, shape, consistency and nontender.   Skin:    General: Skin is warm and dry.  Neurological:     Mental Status: She is alert.      ED Treatments / Results  Labs (all labs ordered are listed, but only abnormal results are displayed) Labs Reviewed  WET PREP, GENITAL - Abnormal; Notable for the following components:      Result Value   WBC, Wet Prep  HPF POC MODERATE (*)    All other components within normal limits  PREGNANCY, URINE  URINALYSIS, ROUTINE W REFLEX MICROSCOPIC  GC/CHLAMYDIA PROBE AMP ([place]    EKG None  Radiology No results found.  Procedures Procedures (including critical care time)  Medications Ordered in ED Medications  fluconazole (DIFLUCAN) tablet 150 mg (150 mg Oral Given 11/04/18 2319)     Initial Impression / Assessment and Plan / ED Course  I have reviewed the triage vital signs and the nursing notes.  Pertinent labs & imaging results that were available during my care of the patient were reviewed by me and considered in my medical decision making (see chart for details).   Final Clinical Impressions(s) / ED Diagnoses   Final diagnoses:  Vaginal discharge   Patient presenting with  vaginal discharge after finishing an antibiotic.  Vaginal discharge is white and chunky consistent with cottage cheese.  Suggestive of yeast infection.  She is also having itching and irritation.  No lesions present.  No cervical motion tenderness.  No adnexal or uterine tenderness present.  She has not had intercourse since she was last tested for STDs.  Less likely secondary to gonorrhea or chlamydia or other STD.  Wet prep does not show any evidence of yeast, however clinically patient has this diagnosis and will treat as such.  No clue cells.  UA without evidence of UTI. Urine pregnancy test negative.  GC/chlamydia sent.  Gave dose of fluconazole in the ED.  Will give nystatin cream for home and repeat dose of fluconazole if she is having continued symptoms in 72 hours.  Advised her to follow-up with her OB/GYN and return to the ER for new or worsening symptoms.  She voiced understanding the plan reasons return.  All questions answered.  ED Discharge Orders         Ordered    fluconazole (DIFLUCAN) 150 MG tablet  Daily     11/04/18 2346    nystatin cream (MYCOSTATIN)     11/04/18 2346             Karrie Meres, PA-C 11/05/18 0010    Maia Plan, MD 11/05/18 1735

## 2018-11-06 LAB — GC/CHLAMYDIA PROBE AMP (~~LOC~~) NOT AT ARMC
Chlamydia: NEGATIVE
Neisseria Gonorrhea: NEGATIVE

## 2018-12-02 ENCOUNTER — Other Ambulatory Visit: Payer: Self-pay

## 2018-12-02 ENCOUNTER — Emergency Department (HOSPITAL_BASED_OUTPATIENT_CLINIC_OR_DEPARTMENT_OTHER)
Admission: EM | Admit: 2018-12-02 | Discharge: 2018-12-02 | Disposition: A | Discharge status: Home / Self Care | Payer: Medicaid Other | Attending: Emergency Medicine | Admitting: Emergency Medicine

## 2018-12-02 ENCOUNTER — Encounter (HOSPITAL_BASED_OUTPATIENT_CLINIC_OR_DEPARTMENT_OTHER): Payer: Self-pay | Admitting: Emergency Medicine

## 2018-12-02 DIAGNOSIS — Z79899 Other long term (current) drug therapy: Secondary | ICD-10-CM | POA: Insufficient documentation

## 2018-12-02 DIAGNOSIS — Z3202 Encounter for pregnancy test, result negative: Secondary | ICD-10-CM | POA: Insufficient documentation

## 2018-12-02 DIAGNOSIS — F17290 Nicotine dependence, other tobacco product, uncomplicated: Secondary | ICD-10-CM | POA: Diagnosis not present

## 2018-12-02 DIAGNOSIS — B9689 Other specified bacterial agents as the cause of diseases classified elsewhere: Secondary | ICD-10-CM

## 2018-12-02 DIAGNOSIS — N760 Acute vaginitis: Secondary | ICD-10-CM | POA: Insufficient documentation

## 2018-12-02 DIAGNOSIS — N898 Other specified noninflammatory disorders of vagina: Secondary | ICD-10-CM | POA: Diagnosis present

## 2018-12-02 LAB — URINALYSIS, ROUTINE W REFLEX MICROSCOPIC
Bilirubin Urine: NEGATIVE
Glucose, UA: NEGATIVE mg/dL
Hgb urine dipstick: NEGATIVE
Ketones, ur: NEGATIVE mg/dL
Leukocytes, UA: NEGATIVE
Nitrite: NEGATIVE
Protein, ur: NEGATIVE mg/dL
Specific Gravity, Urine: 1.01 (ref 1.005–1.030)
pH: 7 (ref 5.0–8.0)

## 2018-12-02 LAB — WET PREP, GENITAL
Sperm: NONE SEEN
Trich, Wet Prep: NONE SEEN
Yeast Wet Prep HPF POC: NONE SEEN

## 2018-12-02 LAB — PREGNANCY, URINE: Preg Test, Ur: NEGATIVE

## 2018-12-02 MED ORDER — METRONIDAZOLE 500 MG PO TABS: 500.0000 mg | ORAL_TABLET | Freq: Two times a day (BID) | ORAL | 0 refills | Status: DC

## 2018-12-02 NOTE — ED Triage Notes (Addendum)
Pt wants to be tested for STD "because she is sexually active" And believes she has BV again and would like to know how to prevent it.

## 2018-12-02 NOTE — Discharge Instructions (Signed)
You were found to have bacterial vaginosis today we will treat with Flagyl.  Do not drink alcohol within 24 hours of taking this medication. You will be called in 3 days if any of your STD tests return positive. In that case, please follow-up with the health department for treatment and make all of your sexual partners aware that they will need to be treated as well. Abstain from intercourse for one week until you have both been treated. Use condoms in the future to help prevent sexually transmitted disease and unwanted pregnancy. You can go to the health department in the future for free STD testing.

## 2018-12-02 NOTE — ED Provider Notes (Signed)
[facility] EMERGENCY DEPARTMENT Provider Note   CSN: [id number] Arrival date & time: 12/02/18  1355     History   Chief Complaint Chief Complaint  Patient presents with  . STD check    HPI Donna Bartlett is a 23 y.o. female with history of recurrent bacterial vaginosis who presents with a 3-day history of vaginal discharge and 1 day history of vaginal odor.  She reports it is typical of her normal BV.  She is sexually active, but has no concern for STD exposure.  She denies any pelvic pain, abnormal vaginal bleeding, back pain, dysuria, urinary frequency, abdominal pain, nausea, vomiting.  HPI  History reviewed. No pertinent past medical history.  Patient Active Problem List   Diagnosis Date Noted  . Injury of right toe, initial encounter 08/25/2017    Past Surgical History:  Procedure Laterality Date  . WRIST SURGERY       OB History   No obstetric history on file.      Home Medications    Prior to Admission medications   Medication Sig Start Date End Date Taking? Authorizing Provider  benzonatate (TESSALON) 100 MG capsule Take 1 capsule (100 mg total) by mouth every 8 (eight) hours. 04/10/18   Petrucelli, Samantha R, PA-C  fluticasone (FLONASE) 50 MCG/ACT nasal spray Place 1 spray into both nostrils daily. 04/10/18   Petrucelli, Samantha R, PA-C  ibuprofen (ADVIL,MOTRIN) 800 MG tablet Take 1 tablet (800 mg total) by mouth every 8 (eight) hours as needed. 04/10/18   Petrucelli, Samantha R, PA-C  lidocaine (XYLOCAINE) 2 % solution Use as directed 15 mLs in the mouth or throat as needed for mouth pain (Do not swallow). 09/18/18   Harlene SaltsMorelli, Brandon A, PA-C  metroNIDAZOLE (FLAGYL) 500 MG tablet Take 1 tablet (500 mg total) by mouth 2 (two) times daily. 12/02/18   Emi HolesLaw, Patirica Longshore M, PA-C  nystatin cream (MYCOSTATIN) Apply to affected area 2 times daily 11/04/18   Couture, Cortni S, PA-C  ranitidine (ZANTAC) 150 MG tablet Take 1 tablet (150 mg total) by mouth 2 (two) times  daily. 02/25/17 03/04/17  Shaune PollackIsaacs, Cameron, MD  sucralfate (CARAFATE) 1 GM/10ML suspension Take 10 mLs (1 g total) by mouth 4 (four) times daily -  with meals and at bedtime. 02/25/17 03/04/17  Shaune PollackIsaacs, Cameron, MD    Family History History reviewed. No pertinent family history.  Social History Social History   Tobacco Use  . Smoking status: Current Every Day Smoker    Types: Cigars  . Smokeless tobacco: Never Used  Substance Use Topics  . Alcohol use: Yes    Comment: rare  . Drug use: Not Currently    Types: Marijuana     Allergies   Other   Review of Systems Review of Systems  Constitutional: Negative for chills and fever.  HENT: Negative for facial swelling and sore throat.   Respiratory: Negative for shortness of breath.   Cardiovascular: Negative for chest pain.  Gastrointestinal: Negative for abdominal pain, nausea and vomiting.  Genitourinary: Positive for vaginal discharge. Negative for dysuria and vaginal bleeding.  Musculoskeletal: Negative for back pain.  Skin: Negative for rash and wound.  Neurological: Negative for headaches.  Psychiatric/Behavioral: The patient is not nervous/anxious.      Physical Exam Updated Vital Signs BP 107/75 (BP Location: Right Arm)   Pulse 74   Temp 98.4 F (36.9 C) (Oral)   Resp 18   Ht 5\' 5"  (1.651 m)   Wt 73.4 kg   SpO2  100%   BMI 26.93 kg/m   Physical Exam Vitals signs and nursing note reviewed.  Constitutional:      General: She is not in acute distress.    Appearance: She is well-developed. She is not diaphoretic.  HENT:     Head: Normocephalic and atraumatic.     Mouth/Throat:     Pharynx: No oropharyngeal exudate.  Eyes:     General: No scleral icterus.       Right eye: No discharge.        Left eye: No discharge.     Conjunctiva/sclera: Conjunctivae normal.     Pupils: Pupils are equal, round, and reactive to light.  Neck:     Musculoskeletal: Normal range of motion and neck supple.     Thyroid: No  thyromegaly.  Cardiovascular:     Rate and Rhythm: Normal rate and regular rhythm.     Heart sounds: Normal heart sounds. No murmur. No friction rub. No gallop.   Pulmonary:     Effort: Pulmonary effort is normal. No respiratory distress.     Breath sounds: Normal breath sounds. No stridor. No wheezing or rales.  Abdominal:     General: Bowel sounds are normal. There is no distension.     Palpations: Abdomen is soft.     Tenderness: There is no abdominal tenderness. There is no right CVA tenderness, left CVA tenderness, guarding or rebound.  Genitourinary:    Comments: Patient refused pelvic exam, specimen collected with self swab Lymphadenopathy:     Cervical: No cervical adenopathy.  Skin:    General: Skin is warm and dry.     Coloration: Skin is not pale.     Findings: No rash.  Neurological:     Mental Status: She is alert.     Coordination: Coordination normal.      ED Treatments / Results  Labs (all labs ordered are listed, but only abnormal results are displayed) Labs Reviewed  WET PREP, GENITAL - Abnormal; Notable for the following components:      Result Value   Clue Cells Wet Prep HPF POC PRESENT (*)    WBC, Wet Prep HPF POC MODERATE (*)    All other components within normal limits  URINALYSIS, ROUTINE W REFLEX MICROSCOPIC - Abnormal; Notable for the following components:   APPearance HAZY (*)    All other components within normal limits  PREGNANCY, URINE  GC/CHLAMYDIA PROBE AMP ([place]) NOT AT [facility]    EKG None  Radiology No results found.  Procedures Procedures (including critical care time)  Medications Ordered in ED Medications - No data to display   Initial Impression / Assessment and Plan / ED Course  I have reviewed the triage vital signs and the nursing notes.  Pertinent labs & imaging results that were available during my care of the patient were reviewed by me and considered in my medical decision making (see chart for details).      Patient presenting with a 3-day history of vaginal discharge.  Wet prep shows clue cells.  Patient declined pelvic exam, however self swab was conducted.  GC/chlamydia sent and pending.  Patient is low suspicion of STD exposure.  Will treat with Flagyl.  Follow-up to OB/GYN for evaluation of recurrent BV.  Patient understands and agrees with plan.  Patient vital stable throughout ED course and discharged in satisfactory condition.  Final Clinical Impressions(s) / ED Diagnoses   Final diagnoses:  BV (bacterial vaginosis)    ED Discharge Orders  Ordered    metroNIDAZOLE (FLAGYL) 500 MG tablet  2 times daily     12/02/18 1844           Emi HolesLaw, Hodari Chuba M, PA-C 12/02/18 Paul [id number]    Belfi, Melanie, MD 12/02/18 [phone]

## 2018-12-02 NOTE — ED Notes (Signed)
Patient refused pelvic exam.  She stated that she told "Alex" that she will do it herself.  I verified this with EDP, "Alex".

## 2018-12-03 LAB — GC/CHLAMYDIA PROBE AMP (~~LOC~~) NOT AT ARMC
Chlamydia: NEGATIVE
Neisseria Gonorrhea: NEGATIVE

## 2019-10-25 ENCOUNTER — Encounter (HOSPITAL_BASED_OUTPATIENT_CLINIC_OR_DEPARTMENT_OTHER): Payer: Self-pay | Admitting: *Deleted

## 2019-10-25 ENCOUNTER — Emergency Department (HOSPITAL_BASED_OUTPATIENT_CLINIC_OR_DEPARTMENT_OTHER)
Admission: EM | Admit: 2019-10-25 | Discharge: 2019-10-26 | Disposition: A | Discharge status: Home / Self Care | Payer: Medicaid Other | Attending: Emergency Medicine | Admitting: Emergency Medicine

## 2019-10-25 ENCOUNTER — Other Ambulatory Visit: Payer: Self-pay

## 2019-10-25 DIAGNOSIS — O2241 Hemorrhoids in pregnancy, first trimester: Secondary | ICD-10-CM | POA: Insufficient documentation

## 2019-10-25 DIAGNOSIS — O99891 Other specified diseases and conditions complicating pregnancy: Secondary | ICD-10-CM | POA: Insufficient documentation

## 2019-10-25 DIAGNOSIS — Z3A09 9 weeks gestation of pregnancy: Secondary | ICD-10-CM | POA: Diagnosis not present

## 2019-10-25 DIAGNOSIS — F17290 Nicotine dependence, other tobacco product, uncomplicated: Secondary | ICD-10-CM | POA: Diagnosis not present

## 2019-10-25 DIAGNOSIS — O99331 Smoking (tobacco) complicating pregnancy, first trimester: Secondary | ICD-10-CM | POA: Insufficient documentation

## 2019-10-25 DIAGNOSIS — M25562 Pain in left knee: Secondary | ICD-10-CM | POA: Diagnosis not present

## 2019-10-25 DIAGNOSIS — O219 Vomiting of pregnancy, unspecified: Secondary | ICD-10-CM | POA: Diagnosis present

## 2019-10-25 DIAGNOSIS — Z79899 Other long term (current) drug therapy: Secondary | ICD-10-CM | POA: Insufficient documentation

## 2019-10-25 DIAGNOSIS — O210 Mild hyperemesis gravidarum: Secondary | ICD-10-CM | POA: Diagnosis not present

## 2019-10-25 LAB — CBC WITH DIFFERENTIAL/PLATELET
Abs Immature Granulocytes: 0.02 10*3/uL (ref 0.00–0.07)
Basophils Absolute: 0 10*3/uL (ref 0.0–0.1)
Basophils Relative: 0 %
Eosinophils Absolute: 0.1 10*3/uL (ref 0.0–0.5)
Eosinophils Relative: 1 %
HCT: 37 % (ref 36.0–46.0)
Hemoglobin: 12.7 g/dL (ref 12.0–15.0)
Immature Granulocytes: 0 %
Lymphocytes Relative: 32 %
Lymphs Abs: 2.8 10*3/uL (ref 0.7–4.0)
MCH: 27.4 pg (ref 26.0–34.0)
MCHC: 34.3 g/dL (ref 30.0–36.0)
MCV: 79.9 fL — ABNORMAL LOW (ref 80.0–100.0)
Monocytes Absolute: 0.7 10*3/uL (ref 0.1–1.0)
Monocytes Relative: 8 %
Neutro Abs: 5.1 10*3/uL (ref 1.7–7.7)
Neutrophils Relative %: 59 %
Platelets: 376 10*3/uL (ref 150–400)
RBC: 4.63 MIL/uL (ref 3.87–5.11)
RDW: 14.3 % (ref 11.5–15.5)
WBC: 8.7 10*3/uL (ref 4.0–10.5)
nRBC: 0 % (ref 0.0–0.2)

## 2019-10-25 MED ORDER — SODIUM CHLORIDE 0.9 % IV BOLUS
1000.0000 mL | Freq: Once | INTRAVENOUS | Status: AC
  Administered 2019-10-25: 1000 mL via INTRAVENOUS

## 2019-10-25 MED ORDER — ONDANSETRON HCL 4 MG/2ML IJ SOLN
4.0000 mg | Freq: Once | INTRAMUSCULAR | Status: AC
  Administered 2019-10-25: 4 mg via INTRAVENOUS
  Filled 2019-10-25: qty 2

## 2019-10-25 NOTE — ED Triage Notes (Signed)
Vomiting. Pt [redacted] weeks pregnant. Last took zofran at 2pm today.

## 2019-10-25 NOTE — ED Provider Notes (Signed)
[facility] EMERGENCY DEPARTMENT Provider Note   CSN: [id number] Arrival date & time: 10/25/19  2240     History Chief Complaint  Patient presents with  . Emesis During Pregnancy    Donna Bartlett is a 23 y.o. female.  Patient is a 23 year old female with no significant past medical history.  She is G1 at approximately [redacted] weeks gestation.  She presents today with complaints of nausea and vomiting.  Patient states that she has been unable to keep anything down all day.  Anytime she eats or drinks, it "comes right back up".  She denies any diarrhea.  She denies any fevers or chills.  She denies any ill contacts.  The history is provided by the patient.       History reviewed. No pertinent past medical history.  Patient Active Problem List   Diagnosis Date Noted  . Injury of right toe, initial encounter 08/25/2017    Past Surgical History:  Procedure Laterality Date  . WRIST SURGERY       OB History    Gravida  1   Para      Term      Preterm      AB      Living        SAB      TAB      Ectopic      Multiple      Live Births              History reviewed. No pertinent family history.  Social History   Tobacco Use  . Smoking status: Current Every Day Smoker    Types: Cigars  . Smokeless tobacco: Never Used  Substance Use Topics  . Alcohol use: Yes    Comment: rare  . Drug use: Not Currently    Types: Marijuana    Home Medications Prior to Admission medications   Medication Sig Start Date End Date Taking? Authorizing Provider  benzonatate (TESSALON) 100 MG capsule Take 1 capsule (100 mg total) by mouth every 8 (eight) hours. 04/10/18   Petrucelli, Samantha R, PA-C  fluticasone (FLONASE) 50 MCG/ACT nasal spray Place 1 spray into both nostrils daily. 04/10/18   Petrucelli, Samantha R, PA-C  ibuprofen (ADVIL,MOTRIN) 800 MG tablet Take 1 tablet (800 mg total) by mouth every 8 (eight) hours as needed. 04/10/18   Petrucelli, Samantha R,  PA-C  lidocaine (XYLOCAINE) 2 % solution Use as directed 15 mLs in the mouth or throat as needed for mouth pain (Do not swallow). 09/18/18   Harlene Salts A, PA-C  metroNIDAZOLE (FLAGYL) 500 MG tablet Take 1 tablet (500 mg total) by mouth 2 (two) times daily. 12/02/18   Emi Holes, PA-C  nystatin cream (MYCOSTATIN) Apply to affected area 2 times daily 11/04/18   Couture, Cortni S, PA-C  ranitidine (ZANTAC) 150 MG tablet Take 1 tablet (150 mg total) by mouth 2 (two) times daily. 02/25/17 03/04/17  Shaune Pollack, MD  sucralfate (CARAFATE) 1 GM/10ML suspension Take 10 mLs (1 g total) by mouth 4 (four) times daily -  with meals and at bedtime. 02/25/17 03/04/17  Shaune Pollack, MD    Allergies    Other  Review of Systems   Review of Systems  All other systems reviewed and are negative.   Physical Exam Updated Vital Signs BP 115/75 (BP Location: Right Arm)   Pulse 85   Temp 98.4 F (36.9 C) (Oral)   Resp 18   Ht 5\' 5"  (1.651 m)  Wt 81.6 kg   SpO2 100%   BMI 29.95 kg/m   Physical Exam Vitals and nursing note reviewed.  Constitutional:      General: She is not in acute distress.    Appearance: She is well-developed. She is not diaphoretic.  HENT:     Head: Normocephalic and atraumatic.     Mouth/Throat:     Mouth: Mucous membranes are moist.  Cardiovascular:     Rate and Rhythm: Normal rate and regular rhythm.     Heart sounds: No murmur. No friction rub. No gallop.   Pulmonary:     Effort: Pulmonary effort is normal. No respiratory distress.     Breath sounds: Normal breath sounds. No wheezing.  Abdominal:     General: Bowel sounds are normal. There is no distension.     Palpations: Abdomen is soft.     Tenderness: There is no abdominal tenderness.  Musculoskeletal:        General: Normal range of motion.     Cervical back: Normal range of motion and neck supple.  Skin:    General: Skin is warm and dry.  Neurological:     Mental Status: She is alert and oriented to  person, place, and time.     ED Results / Procedures / Treatments   Labs (all labs ordered are listed, but only abnormal results are displayed) Labs Reviewed  COMPREHENSIVE METABOLIC PANEL  CBC WITH DIFFERENTIAL/PLATELET  LIPASE, BLOOD    EKG None  Radiology No results found.  Procedures Procedures (including critical care time)  Medications Ordered in ED Medications  sodium chloride 0.9 % bolus 1,000 mL (has no administration in time range)  ondansetron (ZOFRAN) injection 4 mg (has no administration in time range)    ED Course  I have reviewed the triage vital signs and the nursing notes.  Pertinent labs & imaging results that were available during my care of the patient were reviewed by me and considered in my medical decision making (see chart for details).    MDM Rules/Calculators/A&P  Patient presenting here with complaints of nausea and vomiting.  Patient currently pregnant approximately [redacted] weeks gestation.  This is her first pregnancy and has had issues with hyperemesis.  Patient has vomited here in the ER, but appears well-hydrated and laboratory studies are unremarkable.  She was given 2 L of normal saline and has urinated on more than one occasion.  At this point, I feel as though discharge is appropriate.  Patient will be prescribed additional medicine for her nausea and is to follow-up with her OB on Monday.  Final Clinical Impression(s) / ED Diagnoses Final diagnoses:  None    Rx / DC Orders ED Discharge Orders    None       Veryl Speak, MD 10/26/19 [phone]

## 2019-10-25 NOTE — ED Notes (Signed)
ED Provider at bedside. 

## 2019-10-25 NOTE — ED Notes (Signed)
  After giving medications, patient stated she thinks she may have hemorrhoids and would like to be checked.   She also is complaining of L knee pain and would like an x-ray.  Joss RN and Dr. Stark Jock notified.

## 2019-10-26 LAB — COMPREHENSIVE METABOLIC PANEL
ALT: 17 U/L (ref 0–44)
AST: 16 U/L (ref 15–41)
Albumin: 3.9 g/dL (ref 3.5–5.0)
Alkaline Phosphatase: 41 U/L (ref 38–126)
Anion gap: 9 (ref 5–15)
BUN: 6 mg/dL (ref 6–20)
CO2: 26 mmol/L (ref 22–32)
Calcium: 9.9 mg/dL (ref 8.9–10.3)
Chloride: 102 mmol/L (ref 98–111)
Creatinine, Ser: 0.69 mg/dL (ref 0.44–1.00)
GFR calc Af Amer: >60 mL/min (ref >60)
GFR calc non Af Amer: >60 mL/min (ref >60)
Glucose, Bld: 74 mg/dL (ref 70–99)
Potassium: 3.8 mmol/L (ref 3.5–5.1)
Sodium: 137 mmol/L (ref 135–145)
Total Bilirubin: 0.5 mg/dL (ref 0.3–1.2)
Total Protein: 7.5 g/dL (ref 6.5–8.1)

## 2019-10-26 LAB — LIPASE, BLOOD: Lipase: 27 U/L (ref 11–51)

## 2019-10-26 MED ORDER — PROMETHAZINE HCL 25 MG/ML IJ SOLN
12.5000 mg | Freq: Once | INTRAMUSCULAR | Status: DC
  Filled 2019-10-26: qty 1

## 2019-10-26 MED ORDER — ONDANSETRON HCL 4 MG/2ML IJ SOLN: 4.0000 mg | Freq: Once | INTRAMUSCULAR | Status: DC

## 2019-10-26 MED ORDER — PROMETHAZINE HCL 25 MG PO TABS: 25.0000 mg | ORAL_TABLET | Freq: Four times a day (QID) | ORAL | 0 refills | Status: AC | PRN

## 2019-10-26 MED ORDER — SODIUM CHLORIDE 0.9 % IV BOLUS
1000.0000 mL | Freq: Once | INTRAVENOUS | Status: AC
  Administered 2019-10-26: 1000 mL via INTRAVENOUS

## 2019-10-26 NOTE — ED Notes (Signed)
Pt requesting something to eat-given gingerale and graham crackers. Ambulatory to bathroom without difficulty. Updated on POC

## 2019-10-26 NOTE — Discharge Instructions (Signed)
Begin taking Phenergan as prescribed as needed for nausea.  Follow-up with your OB on Monday, and return to the ER if symptoms significantly worsen or change.

## 2019-10-26 NOTE — ED Notes (Signed)
Pt verbalizes understanding of d/c instructions. Ambulatory out of the dept.

## 2019-11-07 ENCOUNTER — Other Ambulatory Visit: Payer: Self-pay

## 2019-11-07 ENCOUNTER — Inpatient Hospital Stay (HOSPITAL_COMMUNITY)
Admission: AD | Admit: 2019-11-07 | Discharge: 2019-11-07 | Disposition: A | Discharge status: Home / Self Care | Payer: Medicaid Other | Attending: Obstetrics & Gynecology | Admitting: Obstetrics & Gynecology

## 2019-11-07 ENCOUNTER — Encounter (HOSPITAL_COMMUNITY): Payer: Self-pay | Admitting: Obstetrics & Gynecology

## 2019-11-07 DIAGNOSIS — O99331 Smoking (tobacco) complicating pregnancy, first trimester: Secondary | ICD-10-CM | POA: Diagnosis not present

## 2019-11-07 DIAGNOSIS — Z79899 Other long term (current) drug therapy: Secondary | ICD-10-CM | POA: Insufficient documentation

## 2019-11-07 DIAGNOSIS — Z3A10 10 weeks gestation of pregnancy: Secondary | ICD-10-CM | POA: Insufficient documentation

## 2019-11-07 DIAGNOSIS — O219 Vomiting of pregnancy, unspecified: Secondary | ICD-10-CM | POA: Insufficient documentation

## 2019-11-07 DIAGNOSIS — F17290 Nicotine dependence, other tobacco product, uncomplicated: Secondary | ICD-10-CM | POA: Insufficient documentation

## 2019-11-07 LAB — URINALYSIS, ROUTINE W REFLEX MICROSCOPIC
Bilirubin Urine: NEGATIVE
Glucose, UA: NEGATIVE mg/dL
Hgb urine dipstick: NEGATIVE
Ketones, ur: NEGATIVE mg/dL
Leukocytes,Ua: NEGATIVE
Nitrite: NEGATIVE
Protein, ur: NEGATIVE mg/dL
Specific Gravity, Urine: 1.017 (ref 1.005–1.030)
pH: 6 (ref 5.0–8.0)

## 2019-11-07 MED ORDER — ONDANSETRON 4 MG PO TBDP: 4.0000 mg | ORAL_TABLET | Freq: Four times a day (QID) | ORAL | 0 refills | Status: DC | PRN

## 2019-11-07 MED ORDER — ONDANSETRON HCL 4 MG PO TABS
4.0000 mg | ORAL_TABLET | Freq: Once | ORAL | Status: AC
  Administered 2019-11-07: 04:00:00 4 mg via ORAL
  Filled 2019-11-07: qty 1

## 2019-11-07 MED ORDER — SCOPOLAMINE 1 MG/3DAYS TD PT72
1.0000 | MEDICATED_PATCH | Freq: Once | TRANSDERMAL | Status: DC
  Administered 2019-11-07: 1.5 mg via TRANSDERMAL
  Filled 2019-11-07: qty 1

## 2019-11-07 NOTE — MAU Note (Addendum)
..  Donna Bartlett is a 23 y.o. at [redacted]w[redacted]d here in MAU reporting: vomiting about 5-6 times a day. She states it is sometimes she vomits after eating and sometimes just drinking water. She states her provider prescribed Zofran and phenergan but she has not had relief. Pt states that after she began taking the phenergan she noticed hyperpigmentation around her lips. Pt states she feels lightheaded and her eyes get red after she vomits and this was not happening before.   Pain score: 0/10 Vitals:   11/07/19 0247  BP: 121/78  Pulse: 99  Resp: 20  Temp: 99.4 F (37.4 C)  SpO2: 100%     FHT: Doppler 166 Lab orders placed from triage: UA

## 2019-11-07 NOTE — MAU Provider Note (Signed)
Chief Complaint: Emesis During Pregnancy   First Provider Initiated Contact with Patient 11/07/19 0317        SUBJECTIVE HPI: Donna Bartlett is a 24 y.o. G2P0010 at [redacted]w[redacted]d by LMP who presents to maternity admissions reporting nausea and vomiting.  Sometimes twice a day, sometimes 5/day.  Uses phenergan and zofran but ran out of zofran and only takes them occasionally.  . She denies vaginal bleeding, vaginal itching/burning, urinary symptoms, h/a, dizziness, or fever/chills.    Gets care in [facility] but wants to deliver here  Emesis  This is a recurrent problem. The current episode started 1 to 4 weeks ago. The problem occurs 2 to 4 times per day. The problem has been unchanged. There has been no fever. Pertinent negatives include no abdominal pain, chills, diarrhea, fever or myalgias. She has tried increased fluids for the symptoms. The treatment provided mild relief.   RN Note: Donna Bartlett is a 24 y.o. at [redacted]w[redacted]d here in MAU reporting: vomiting about 5-6 times a day. She states it is sometimes she vomits after eating and sometimes just drinking water. She states her provider prescribed Zofran and phenergan but she has not had relief. Pt states that after she began taking the phenergan she noticed hyperpigmentation around her lips. Pt states she feels lightheaded and her eyes get red after she vomits and this was not happening before.   History reviewed. No pertinent past medical history. Past Surgical History:  Procedure Laterality Date  . WRIST SURGERY     Social History   Socioeconomic History  . Marital status: Single    Spouse name: Not on file  . Number of children: Not on file  . Years of education: Not on file  . Highest education level: Not on file  Occupational History  . Not on file  Tobacco Use  . Smoking status: Current Every Day Smoker    Types: Cigars  . Smokeless tobacco: Never Used  Substance and Sexual Activity  . Alcohol use: Not Currently    Comment: rare  .  Drug use: Not Currently    Types: Marijuana  . Sexual activity: Yes    Birth control/protection: None  Other Topics Concern  . Not on file  Social History Narrative  . Not on file   Social Determinants of Health   Financial Resource Strain:   . Difficulty of Paying Living Expenses: Not on file  Food Insecurity:   . Worried About Programme researcher, broadcasting/film/video in the Last Year: Not on file  . Ran Out of Food in the Last Year: Not on file  Transportation Needs:   . Lack of Transportation (Medical): Not on file  . Lack of Transportation (Non-Medical): Not on file  Physical Activity:   . Days of Exercise per Week: Not on file  . Minutes of Exercise per Session: Not on file  Stress:   . Feeling of Stress : Not on file  Social Connections:   . Frequency of Communication with Friends and Family: Not on file  . Frequency of Social Gatherings with Friends and Family: Not on file  . Attends Religious Services: Not on file  . Active Member of Clubs or Organizations: Not on file  . Attends Banker Meetings: Not on file  . Marital Status: Not on file  Intimate Partner Violence:   . Fear of Current or Ex-Partner: Not on file  . Emotionally Abused: Not on file  . Physically Abused: Not on file  . Sexually  Abused: Not on file   No current facility-administered medications on file prior to encounter.   Current Outpatient Medications on File Prior to Encounter  Medication Sig Dispense Refill  . Prenatal Vit-Fe Fumarate-FA (MULTIVITAMIN-PRENATAL) 27-0.8 MG TABS tablet Take 1 tablet by mouth daily at 12 noon.    . promethazine (PHENERGAN) 25 MG tablet Take 1 tablet (25 mg total) by mouth every 6 (six) hours as needed for nausea or vomiting. 15 tablet 0  . benzonatate (TESSALON) 100 MG capsule Take 1 capsule (100 mg total) by mouth every 8 (eight) hours. 21 capsule 0  . fluticasone (FLONASE) 50 MCG/ACT nasal spray Place 1 spray into both nostrils daily. 16 g 0  . ibuprofen (ADVIL,MOTRIN)  800 MG tablet Take 1 tablet (800 mg total) by mouth every 8 (eight) hours as needed. 30 tablet 0  . lidocaine (XYLOCAINE) 2 % solution Use as directed 15 mLs in the mouth or throat as needed for mouth pain (Do not swallow). 100 mL 0  . metroNIDAZOLE (FLAGYL) 500 MG tablet Take 1 tablet (500 mg total) by mouth 2 (two) times daily. 14 tablet 0  . nystatin cream (MYCOSTATIN) Apply to affected area 2 times daily 15 g 0  . ranitidine (ZANTAC) 150 MG tablet Take 1 tablet (150 mg total) by mouth 2 (two) times daily. 14 tablet 0  . sucralfate (CARAFATE) 1 GM/10ML suspension Take 10 mLs (1 g total) by mouth 4 (four) times daily -  with meals and at bedtime. 420 mL 0   Allergies  Allergen Reactions  . Other Swelling    Hazel nut allergy    I have reviewed patient's Past Medical Hx, Surgical Hx, Family Hx, Social Hx, medications and allergies.   ROS:  Review of Systems  Constitutional: Negative for chills and fever.  Gastrointestinal: Positive for vomiting. Negative for abdominal pain and diarrhea.  Musculoskeletal: Negative for myalgias.   Review of Systems  Other systems negative   Physical Exam  Physical Exam Patient Vitals for the past 24 hrs:  BP Temp Temp src Pulse Resp SpO2 Height Weight  11/07/19 0247 121/78 99.4 F (37.4 C) Oral 99 20 100 % -- --  11/07/19 0239 -- -- -- -- -- -- 5\' 5"  (1.651 m) 83 kg   Constitutional: Well-developed, female in no acute distress.  Cardiovascular: normal rate Respiratory: normal effort GI: Abd soft, non-tender. Pos BS x 4 MS: Extremities nontender, no edema, normal ROM Neurologic: Alert and oriented x 4.  GU: Neg CVAT.  PELVIC EXAM: deferred  FHT 166 by doppler  LAB RESULTS Results for orders placed or performed during the hospital encounter of 11/07/19 (from the past 24 hour(s))  Urinalysis, Routine w reflex microscopic     Status: None   Collection Time: 11/07/19  3:06 AM  Result Value Ref Range   Color, Urine YELLOW YELLOW    APPearance CLEAR CLEAR   Specific Gravity, Urine 1.017 1.005 - 1.030   pH 6.0 5.0 - 8.0   Glucose, UA NEGATIVE NEGATIVE mg/dL   Hgb urine dipstick NEGATIVE NEGATIVE   Bilirubin Urine NEGATIVE NEGATIVE   Ketones, ur NEGATIVE NEGATIVE mg/dL   Protein, ur NEGATIVE NEGATIVE mg/dL   Nitrite NEGATIVE NEGATIVE   Leukocytes,Ua NEGATIVE NEGATIVE     IMAGING No results found.  MAU Management/MDM: Urine showed she is pretty well hydrated Zofran and Scop patch given Able to tolerate crackers and ginger ale afterward  ASSESSMENT Single intrauterine pregnancy at [redacted]w[redacted]d Nausea and vomiting  PLAN Discharge home  Rx Zofran refilled for nausea May also use Phenergan and alternate tham Pt stable at time of discharge. Encouraged to return here or to other Urgent Care/ED if she develops worsening of symptoms, increase in pain, fever, or other concerning symptoms.    Hansel Feinstein CNM, MSN Certified Nurse-Midwife 11/07/2019  3:17 AM

## 2019-11-07 NOTE — Discharge Instructions (Signed)
Morning Sickness  Morning sickness is when you feel sick to your stomach (nauseous) during pregnancy. You may feel sick to your stomach and throw up (vomit). You may feel sick in the morning, but you can feel this way at any time of day. Some women feel very sick to their stomach and cannot stop throwing up (hyperemesis gravidarum). Follow these instructions at home: Medicines  Take over-the-counter and prescription medicines only as told by your doctor. Do not take any medicines until you talk with your doctor about them first.  Taking multivitamins before getting pregnant can stop or lessen the harshness of morning sickness. Eating and drinking  Eat dry toast or crackers before getting out of bed.  Eat 5 or 6 small meals a day.  Eat dry and bland foods like rice and baked potatoes.  Do not eat greasy, fatty, or spicy foods.  Have someone cook for you if the smell of food causes you to feel sick or throw up.  If you feel sick to your stomach after taking prenatal vitamins, take them at night or with a snack.  Eat protein when you need a snack. Nuts, yogurt, and cheese are good choices.  Drink fluids throughout the day.  Try ginger ale made with real ginger, ginger tea made from fresh grated ginger, or ginger candies. General instructions  Do not use any products that have nicotine or tobacco in them, such as cigarettes and e-cigarettes. If you need help quitting, ask your doctor.  Use an air purifier to keep the air in your house free of smells.  Get lots of fresh air.  Try to avoid smells that make you feel sick.  Try: ? Wearing a bracelet that is used for seasickness (acupressure wristband). ? Going to a doctor who puts thin needles into certain body points (acupuncture) to improve how you feel. Contact a doctor if:  You need medicine to feel better.  You feel dizzy or light-headed.  You are losing weight. Get help right away if:  You feel very sick to your  stomach and cannot stop throwing up.  You pass out (faint).  You have very bad pain in your belly. Summary  Morning sickness is when you feel sick to your stomach (nauseous) during pregnancy.  You may feel sick in the morning, but you can feel this way at any time of day.  Making some changes to what you eat may help your symptoms go away. This information is not intended to replace advice given to you by your health care provider. Make sure you discuss any questions you have with your health care provider. Document Revised: 09/29/2017 Document Reviewed: 11/17/2016 Elsevier Patient Education  2020 [place] for [employer] at [facility]       Phone: [phone]  Center for [employer] at [place]   Phone: [place] for [employer] at [place]  Phone: [place] for [place] at [facility]  Phone: [place] for [place] at [place]  Phone: [place] for [place] at [facility]   Phone: [facility]       Phone: [phone]  [facility]    Phone: [facility]    Phone: [phone]  [facility]    Phone: [phone]  [place]    Phone: [phone]  [facility] Department-Family  Planning       Phone: [phone]   [facility]  Phone: [phone]  [facility]    Phone: [phone]  [facility] [facility]   Phone: [phone]  [facility]      Phone: [phone]  [facility]    Phone: [phone]

## 2019-12-07 ENCOUNTER — Other Ambulatory Visit: Payer: Self-pay | Admitting: Advanced Practice Midwife

## 2019-12-10 NOTE — Telephone Encounter (Signed)
NEEDS TO MAKE APPOINTMENT FOR PRENATAL CARE.  Cannot refill again without appt.

## 2020-01-05 IMAGING — CR DG CHEST 2V
2 series · 2 of 2 positions shown · non-contrast
Comparison: 02/25/2017.

CLINICAL DATA: Cough.  Fever.

EXAM:
CHEST - 2 VIEW

[w chest pa]
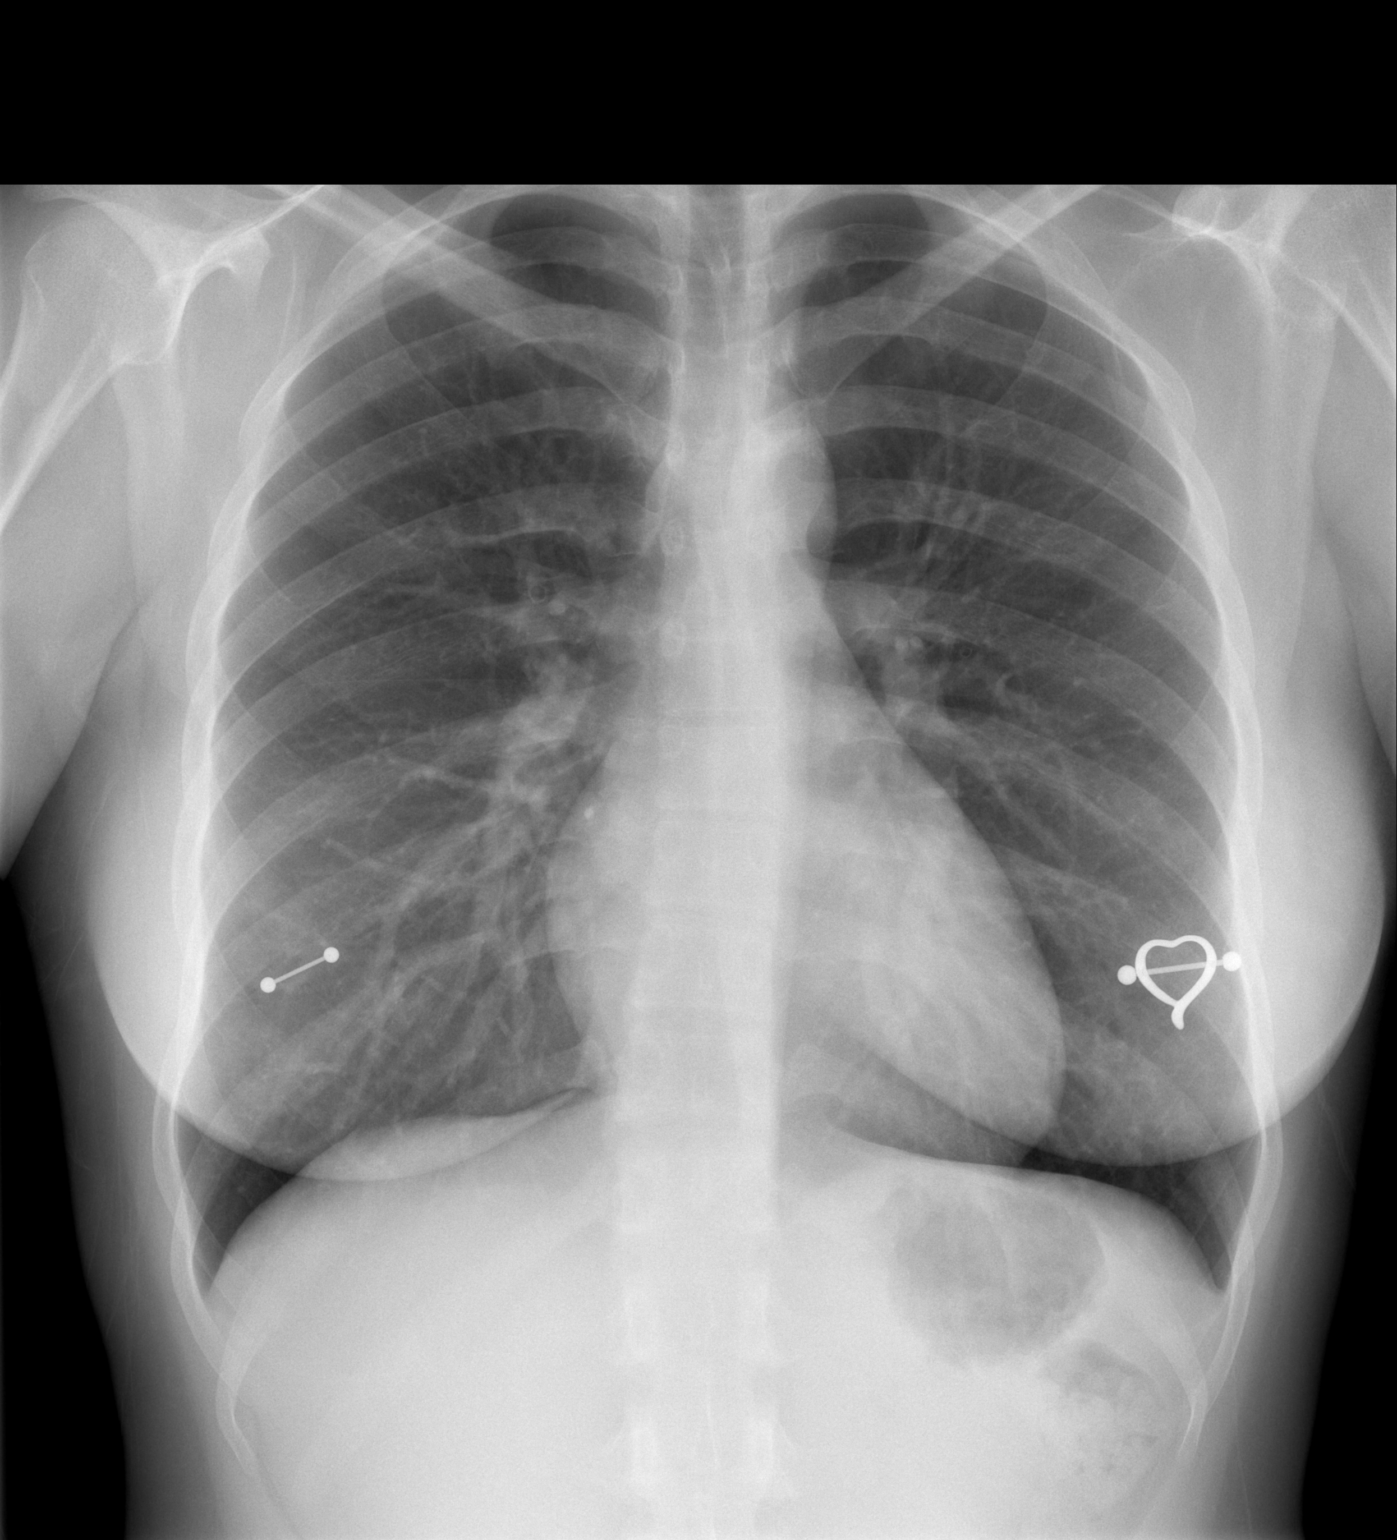

[w chest lat]
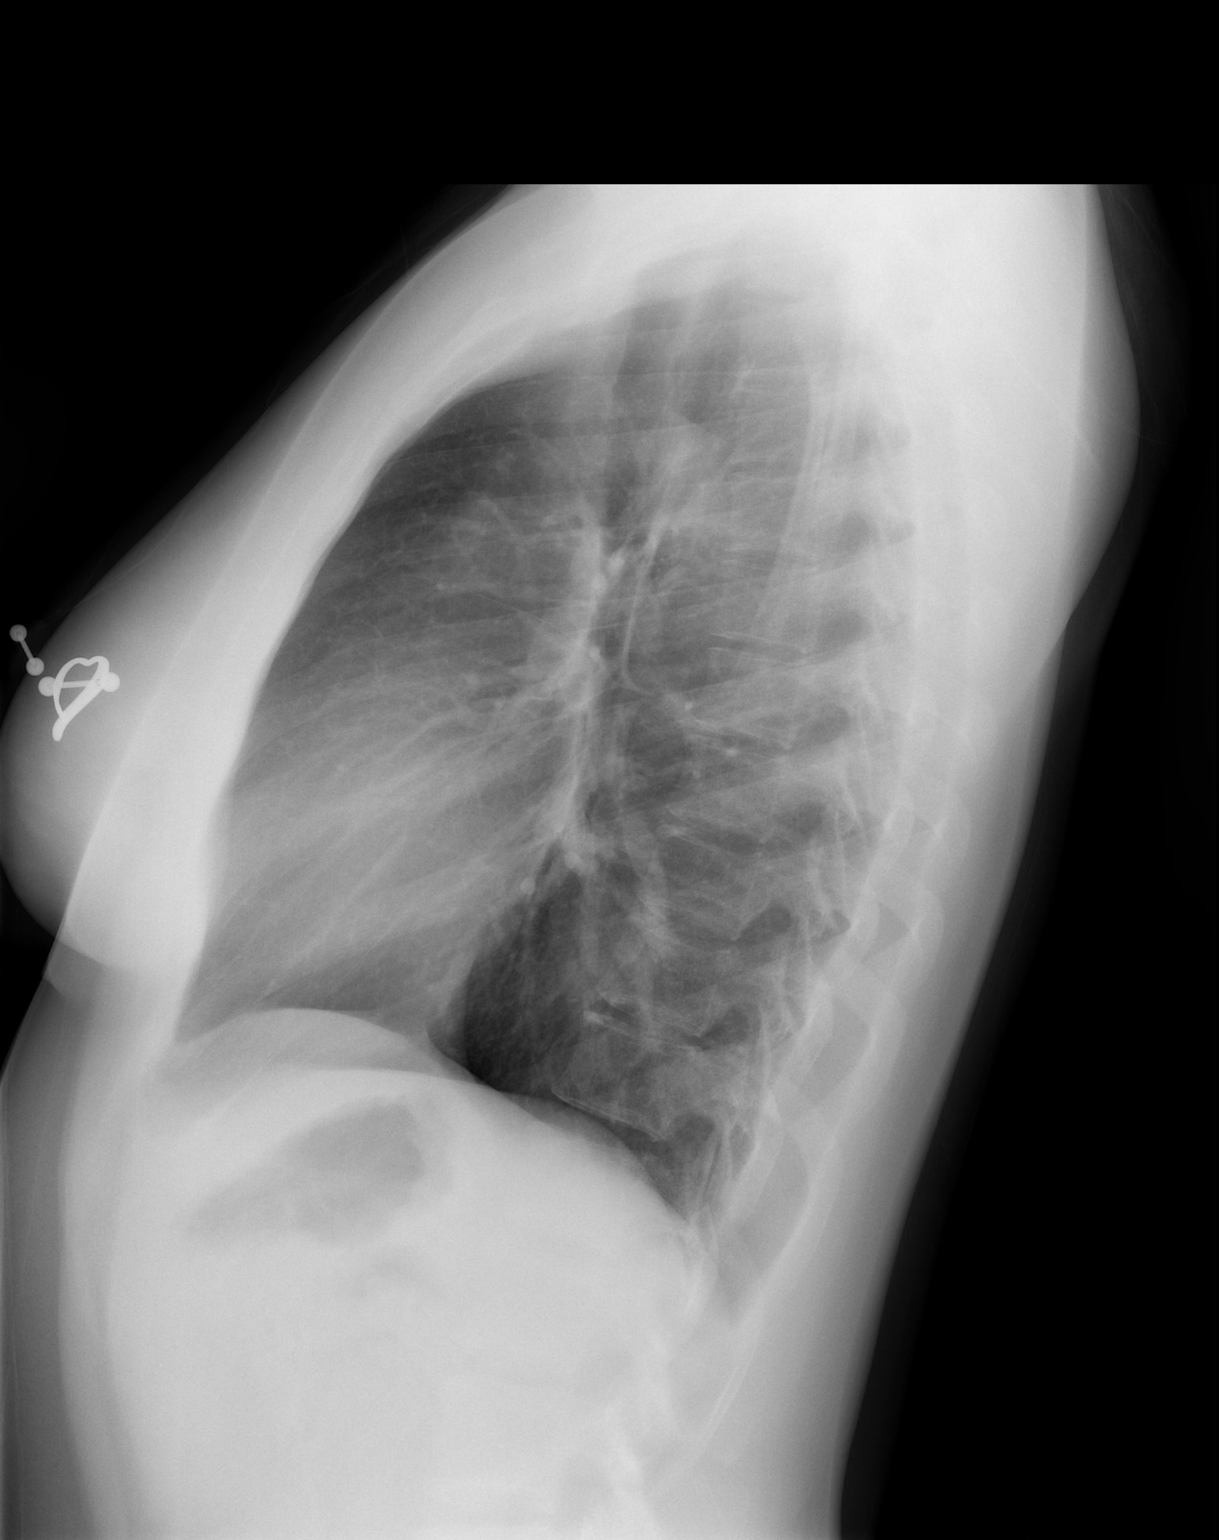

[2 of 2 positions shown; findings below may reference images not displayed]

FINDINGS: Mediastinum and hilar structures normal. Lungs are clear. No pleural
effusion or pneumothorax. Heart size normal. No acute bony
abnormality. Mild thoracic spine scoliosis.
IMPRESSION: No acute cardiopulmonary disease.

## 2020-10-24 ENCOUNTER — Emergency Department (HOSPITAL_BASED_OUTPATIENT_CLINIC_OR_DEPARTMENT_OTHER)
Admission: EM | Admit: 2020-10-24 | Discharge: 2020-10-25 | Disposition: A | Discharge status: Home / Self Care | Payer: Medicaid Other | Attending: Emergency Medicine | Admitting: Emergency Medicine

## 2020-10-24 ENCOUNTER — Encounter (HOSPITAL_BASED_OUTPATIENT_CLINIC_OR_DEPARTMENT_OTHER): Payer: Self-pay | Admitting: Emergency Medicine

## 2020-10-24 ENCOUNTER — Other Ambulatory Visit: Payer: Self-pay

## 2020-10-24 DIAGNOSIS — S50361A Insect bite (nonvenomous) of right elbow, initial encounter: Secondary | ICD-10-CM | POA: Diagnosis not present

## 2020-10-24 DIAGNOSIS — Z5321 Procedure and treatment not carried out due to patient leaving prior to being seen by health care provider: Secondary | ICD-10-CM | POA: Diagnosis not present

## 2020-10-24 DIAGNOSIS — W57XXXA Bitten or stung by nonvenomous insect and other nonvenomous arthropods, initial encounter: Secondary | ICD-10-CM | POA: Diagnosis not present

## 2020-10-24 NOTE — ED Triage Notes (Signed)
Reports she thinks she got bit on the right elbow.  Swelling and warmth noted to the area.  Endorses itching.  Has not taken anything at home.

## 2020-10-25 NOTE — ED Notes (Signed)
Pt did not answer when name was called times 3

## 2021-01-01 ENCOUNTER — Other Ambulatory Visit: Payer: Self-pay

## 2021-01-01 ENCOUNTER — Encounter (HOSPITAL_BASED_OUTPATIENT_CLINIC_OR_DEPARTMENT_OTHER): Payer: Self-pay | Admitting: *Deleted

## 2021-01-01 ENCOUNTER — Emergency Department (HOSPITAL_BASED_OUTPATIENT_CLINIC_OR_DEPARTMENT_OTHER)
Admission: EM | Admit: 2021-01-01 | Discharge: 2021-01-01 | Disposition: A | Discharge status: Home / Self Care | Payer: Medicaid Other | Attending: Emergency Medicine | Admitting: Emergency Medicine

## 2021-01-01 DIAGNOSIS — B9689 Other specified bacterial agents as the cause of diseases classified elsewhere: Secondary | ICD-10-CM | POA: Diagnosis not present

## 2021-01-01 DIAGNOSIS — R102 Pelvic and perineal pain: Secondary | ICD-10-CM | POA: Diagnosis present

## 2021-01-01 DIAGNOSIS — Z202 Contact with and (suspected) exposure to infections with a predominantly sexual mode of transmission: Secondary | ICD-10-CM | POA: Insufficient documentation

## 2021-01-01 DIAGNOSIS — F17290 Nicotine dependence, other tobacco product, uncomplicated: Secondary | ICD-10-CM | POA: Diagnosis not present

## 2021-01-01 DIAGNOSIS — N760 Acute vaginitis: Secondary | ICD-10-CM | POA: Insufficient documentation

## 2021-01-01 DIAGNOSIS — Z711 Person with feared health complaint in whom no diagnosis is made: Secondary | ICD-10-CM

## 2021-01-01 HISTORY — DX: Other specified bacterial agents as the cause of diseases classified elsewhere: B96.89

## 2021-01-01 LAB — URINALYSIS, ROUTINE W REFLEX MICROSCOPIC
Bilirubin Urine: NEGATIVE
Glucose, UA: NEGATIVE mg/dL
Hgb urine dipstick: NEGATIVE
Ketones, ur: NEGATIVE mg/dL
Leukocytes,Ua: NEGATIVE
Nitrite: NEGATIVE
Protein, ur: NEGATIVE mg/dL
Specific Gravity, Urine: 1.02 (ref 1.005–1.030)
pH: 8 (ref 5.0–8.0)

## 2021-01-01 LAB — WET PREP, GENITAL
Sperm: NONE SEEN
Trich, Wet Prep: NONE SEEN
Yeast Wet Prep HPF POC: NONE SEEN

## 2021-01-01 LAB — PREGNANCY, URINE: Preg Test, Ur: NEGATIVE

## 2021-01-01 MED ORDER — LIDOCAINE HCL (PF) 1 % IJ SOLN
INTRAMUSCULAR | Status: AC
  Administered 2021-01-01: 5 mL
  Filled 2021-01-01: qty 5

## 2021-01-01 MED ORDER — METRONIDAZOLE 500 MG PO TABS: 500.0000 mg | ORAL_TABLET | Freq: Two times a day (BID) | ORAL | 0 refills | Status: AC

## 2021-01-01 MED ORDER — CEFTRIAXONE SODIUM 500 MG IJ SOLR
500.0000 mg | Freq: Once | INTRAMUSCULAR | Status: AC
  Administered 2021-01-01: 500 mg via INTRAMUSCULAR
  Filled 2021-01-01: qty 500

## 2021-01-01 MED ORDER — DOXYCYCLINE HYCLATE 100 MG PO CAPS: 100.0000 mg | ORAL_CAPSULE | Freq: Two times a day (BID) | ORAL | 0 refills | Status: AC

## 2021-01-01 NOTE — ED Triage Notes (Signed)
Pt. Reports she has BV and gets it every month.  Pt. Reports this started several days ago.  Pt. Reports she noticed when the smell started.  Some discharge due to the BV.

## 2021-01-01 NOTE — Discharge Instructions (Signed)
Take Flagyl as directed.  It is very important that you do not consume any alcohol while taking this medication as it will cause you to become violently ill.  Take antibiotics as directed. Please take all of your antibiotics until finished.  Follow up with your OB/GYN doctor.   You have been tested today for gonorrhea/committee.  The results will take about 2 days to come back.  If you are positive, your partner needs to be tested and treated.  Do not have any intercourse until both you and your partner are treated and completed the in about a course.  Return to emergency department for any worsening vaginal discharge, abdominal pain, nausea/vomiting, fevers.

## 2021-01-01 NOTE — ED Provider Notes (Signed)
[facility] EMERGENCY DEPARTMENT Provider Note   CSN: [id number] Arrival date & time: 01/01/21  1935     History Chief Complaint  Patient presents with  . Vaginal Itching    Donna Bartlett is a 25 y.o. female positive for bacterial vaginosis who presents for evaluation of concerns for recurrent BV infection.  She reports she has had some vaginal irritation, vaginal odor, vaginal discharge over the last several days.  She reports she gets frequent BV infections.  She has seen her OB/GYN for this.  She reports that her last episode was in the beginning of February.  She took antibiotics and then is currently still taking boric acid suppositories.  She denies any new partners.  She is currently sexually active with one partner.  They sometimes use protection.  She does not recall when her last menstrual cycle is as she is on birth control on has irregular periods.  She has not noted any abdominal pain, nausea/vomiting, fevers.  She has not noted any vaginal lesions, rashes, sores.  The history is provided by the patient.       Past Medical History:  Diagnosis Date  . Bacterial vaginosis     Patient Active Problem List   Diagnosis Date Noted  . Injury of right toe, initial encounter 08/25/2017    Past Surgical History:  Procedure Laterality Date  . WRIST SURGERY       OB History    Gravida  2   Para      Term      Preterm      AB  1   Living        SAB  1   IAB      Ectopic      Multiple      Live Births              No family history on file.  Social History   Tobacco Use  . Smoking status: Current Every Day Smoker    Types: Cigars  . Smokeless tobacco: Never Used  Vaping Use  . Vaping Use: Never used  Substance Use Topics  . Alcohol use: Not Currently    Comment: rare  . Drug use: Not Currently    Types: Marijuana    Home Medications Prior to Admission medications   Medication Sig Start Date End Date Taking? Authorizing  Provider  doxycycline (VIBRAMYCIN) 100 MG capsule Take 1 capsule (100 mg total) by mouth 2 (two) times daily for 7 days. 01/01/21 01/08/21 Yes Maxwell Caul, PA-C  metroNIDAZOLE (FLAGYL) 500 MG tablet Take 1 tablet (500 mg total) by mouth 2 (two) times daily. 01/01/21  Yes Maxwell Caul, PA-C  benzonatate (TESSALON) 100 MG capsule Take 1 capsule (100 mg total) by mouth every 8 (eight) hours. 04/10/18   Petrucelli, Samantha R, PA-C  fluticasone (FLONASE) 50 MCG/ACT nasal spray Place 1 spray into both nostrils daily. 04/10/18   Petrucelli, Samantha R, PA-C  ondansetron (ZOFRAN-ODT) 4 MG disintegrating tablet DISSOLVE 1 TABLET(4 MG) ON THE TONGUE EVERY 6 HOURS AS NEEDED FOR NAUSEA 12/10/19   Aviva Signs, CNM  Prenatal Vit-Fe Fumarate-FA (MULTIVITAMIN-PRENATAL) 27-0.8 MG TABS tablet Take 1 tablet by mouth daily at 12 noon.    [provider]  promethazine (PHENERGAN) 25 MG tablet Take 1 tablet (25 mg total) by mouth every 6 (six) hours as needed for nausea or vomiting. 10/26/19   Geoffery Lyons, MD  ranitidine (ZANTAC) 150 MG tablet Take 1 tablet (  150 mg total) by mouth 2 (two) times daily. 02/25/17 03/04/17  Shaune Pollack, MD  sucralfate (CARAFATE) 1 GM/10ML suspension Take 10 mLs (1 g total) by mouth 4 (four) times daily -  with meals and at bedtime. 02/25/17 03/04/17  Shaune Pollack, MD    Allergies    Other  Review of Systems   Review of Systems  Constitutional: Negative for fever.  Gastrointestinal: Negative for abdominal pain, nausea and vomiting.  Genitourinary: Positive for vaginal discharge.  Neurological: Negative for weakness and numbness.  All other systems reviewed and are negative.   Physical Exam Updated Vital Signs BP 119/80 (BP Location: Right Arm)   Pulse 91   Temp 98.4 F (36.9 C) (Oral)   Resp 18   LMP 01/01/2021 (Approximate)   SpO2 99%   Physical Exam Vitals and nursing note reviewed.  Constitutional:      Appearance: She is well-developed and  well-nourished.  HENT:     Head: Normocephalic and atraumatic.  Eyes:     General: No scleral icterus.       Right eye: No discharge.        Left eye: No discharge.     Extraocular Movements: EOM normal.     Conjunctiva/sclera: Conjunctivae normal.  Pulmonary:     Effort: Pulmonary effort is normal.  Abdominal:     Comments: Abdomen is soft, non-distended, non-tender. No rigidity, No guarding. No peritoneal signs.  Skin:    General: Skin is warm and dry.  Neurological:     Mental Status: She is alert.  Psychiatric:        Mood and Affect: Mood and affect normal.        Speech: Speech normal.        Behavior: Behavior normal.     ED Results / Procedures / Treatments   Labs (all labs ordered are listed, but only abnormal results are displayed) Labs Reviewed  WET PREP, GENITAL - Abnormal; Notable for the following components:      Result Value   Clue Cells Wet Prep HPF POC PRESENT (*)    WBC, Wet Prep HPF POC FEW (*)    All other components within normal limits  URINALYSIS, ROUTINE W REFLEX MICROSCOPIC - Abnormal; Notable for the following components:   APPearance CLOUDY (*)    All other components within normal limits  PREGNANCY, URINE  GC/CHLAMYDIA PROBE AMP ([place]) NOT AT [facility]    EKG None  Radiology No results found.  Procedures Procedures   Medications Ordered in ED Medications  cefTRIAXone (ROCEPHIN) injection 500 mg (500 mg Intramuscular Given 01/01/21 2057)  lidocaine (PF) (XYLOCAINE) 1 % injection (5 mLs  Given 01/01/21 2057)    ED Course  I have reviewed the triage vital signs and the nursing notes.  Pertinent labs & imaging results that were available during my care of the patient were reviewed by me and considered in my medical decision making (see chart for details).    MDM Rules/Calculators/A&P                          25 year old female who presents for evaluation of vaginal irritation, vaginal discharge is been ongoing for last 3 days.   History of BV infection states this feels similar to her BV infections.  No new partners.  No fevers, abdominal pain.  On initial arrival, she is afebrile, nontoxic-appearing.  Abdomen exam is benign.  Patient states she is not having any vaginal  bleeding.  We discussed work-up and treatment options here in the ED.  I offered full pelvic exam with testing for gonorrhea/chlyamddia and wet prep.  We also discussed doing self swabs.  Patient given both options and engaged in shared decision making.  Patient opted to decline full pelvic exam here in the ED and would like to do the self swab.  UA shows no infectious etiology.  Urine pregnancy is negative.  Clue cells are present.  Discussed results with patient.  I discussed with her regarding her clue cells present in the wet prep.  I discussed treatment option, including p.o. antibiotics versus gel.  Patient would prefer p.o. antibiotics.  Additionally, discussed with her that she has a chlamydia and gonorrhea test pending.  I discussed with her that the results will take about 2 days to come back.  We discussed treatment options.  Patient will be further referred to treat today. Patient is not currently breast feeding. Patient with no known drug allergies. Patient had ample opportunity for questions and discussion. All patient's questions were answered with full understanding. Strict return precautions discussed. Patient expresses understanding and agreement to plan.   Portions of this note were generated with Scientist, clinical (histocompatibility and immunogenetics). Dictation errors may occur despite best attempts at proofreading.  Final Clinical Impression(s) / ED Diagnoses Final diagnoses:  Bacterial vaginosis  Concern about STD in female without diagnosis    Rx / DC Orders ED Discharge Orders         Ordered    doxycycline (VIBRAMYCIN) 100 MG capsule  2 times daily        01/01/21 2046    metroNIDAZOLE (FLAGYL) 500 MG tablet  2 times daily        01/01/21 2046            Rosana Hoes 01/01/21 2102    Charlynne Pander, MD 01/01/21 [phone]

## 2021-01-04 LAB — GC/CHLAMYDIA PROBE AMP (~~LOC~~) NOT AT ARMC
Chlamydia: NEGATIVE
Comment: NEGATIVE
Comment: NORMAL
Neisseria Gonorrhea: NEGATIVE

## 2021-05-27 ENCOUNTER — Emergency Department (HOSPITAL_BASED_OUTPATIENT_CLINIC_OR_DEPARTMENT_OTHER): Payer: Medicaid Other

## 2021-05-27 ENCOUNTER — Other Ambulatory Visit: Payer: Self-pay

## 2021-05-27 ENCOUNTER — Encounter (HOSPITAL_BASED_OUTPATIENT_CLINIC_OR_DEPARTMENT_OTHER): Payer: Self-pay | Admitting: *Deleted

## 2021-05-27 ENCOUNTER — Emergency Department (HOSPITAL_BASED_OUTPATIENT_CLINIC_OR_DEPARTMENT_OTHER)
Admission: EM | Admit: 2021-05-27 | Discharge: 2021-05-27 | Disposition: A | Discharge status: Home / Self Care | Payer: Medicaid Other | Attending: Emergency Medicine | Admitting: Emergency Medicine

## 2021-05-27 DIAGNOSIS — M7989 Other specified soft tissue disorders: Secondary | ICD-10-CM

## 2021-05-27 DIAGNOSIS — R2241 Localized swelling, mass and lump, right lower limb: Secondary | ICD-10-CM | POA: Diagnosis present

## 2021-05-27 DIAGNOSIS — F17290 Nicotine dependence, other tobacco product, uncomplicated: Secondary | ICD-10-CM | POA: Insufficient documentation

## 2021-05-27 NOTE — Discharge Instructions (Addendum)
Try compression stockings and leg elevation to keep your swelling down.  You do not have a blood clot in your leg right now.  See your doctor for follow-up  Return to ER if you have worse leg swelling, chest pain, shortness of breath

## 2021-05-27 NOTE — ED Provider Notes (Signed)
[facility] EMERGENCY DEPARTMENT Provider Note   CSN: [id number] Arrival date & time: 05/27/21  1517     History Chief Complaint  Patient presents with   Leg Pain    Terrianne Cavness is a 25 y.o. female here presenting with right leg pain.  Patient states that for the last several days, she noticed right ankle swelling that goes up to her calf.  She states that it is worse when she is on her feet.  She states that she is on contraceptives.  She denies any fall or injury to the leg. Denies any chest pain or shortness of breath.  Denies any recent travel or history of blood clots.  The history is provided by the patient.      Past Medical History:  Diagnosis Date   Bacterial vaginosis     Patient Active Problem List   Diagnosis Date Noted   Injury of right toe, initial encounter 08/25/2017    Past Surgical History:  Procedure Laterality Date   WRIST SURGERY       OB History     Gravida  2   Para      Term      Preterm      AB  1   Living         SAB  1   IAB      Ectopic      Multiple      Live Births              No family history on file.  Social History   Tobacco Use   Smoking status: Every Day    Types: Cigars   Smokeless tobacco: Never  Vaping Use   Vaping Use: Never used  Substance Use Topics   Alcohol use: Not Currently    Comment: rare   Drug use: Not Currently    Types: Marijuana    Home Medications Prior to Admission medications   Medication Sig Start Date End Date Taking? Authorizing Provider  benzonatate (TESSALON) 100 MG capsule Take 1 capsule (100 mg total) by mouth every 8 (eight) hours. 04/10/18   Petrucelli, Samantha R, PA-C  fluticasone (FLONASE) 50 MCG/ACT nasal spray Place 1 spray into both nostrils daily. 04/10/18   Petrucelli, Samantha R, PA-C  metroNIDAZOLE (FLAGYL) 500 MG tablet Take 1 tablet (500 mg total) by mouth 2 (two) times daily. 01/01/21   Graciella Freer A, PA-C  ondansetron (ZOFRAN-ODT) 4 MG  disintegrating tablet DISSOLVE 1 TABLET(4 MG) ON THE TONGUE EVERY 6 HOURS AS NEEDED FOR NAUSEA 12/10/19   Aviva Signs, CNM  Prenatal Vit-Fe Fumarate-FA (MULTIVITAMIN-PRENATAL) 27-0.8 MG TABS tablet Take 1 tablet by mouth daily at 12 noon.    [provider]  promethazine (PHENERGAN) 25 MG tablet Take 1 tablet (25 mg total) by mouth every 6 (six) hours as needed for nausea or vomiting. 10/26/19   Geoffery Lyons, MD  ranitidine (ZANTAC) 150 MG tablet Take 1 tablet (150 mg total) by mouth 2 (two) times daily. 02/25/17 03/04/17  Shaune Pollack, MD  sucralfate (CARAFATE) 1 GM/10ML suspension Take 10 mLs (1 g total) by mouth 4 (four) times daily -  with meals and at bedtime. 02/25/17 03/04/17  Shaune Pollack, MD    Allergies    Other  Review of Systems   Review of Systems  Cardiovascular:  Positive for leg swelling.  All other systems reviewed and are negative.  Physical Exam Updated Vital Signs BP 107/67 (BP Location: Left Arm)  Pulse 100   Temp 98.5 F (36.9 C) (Oral)   Resp 16   Ht 5\' 5"  (1.651 m)   Wt 74 kg   LMP 04/30/2021   SpO2 100%   BMI 27.15 kg/m   Physical Exam Vitals and nursing note reviewed.  Constitutional:      Appearance: Normal appearance.  HENT:     Head: Normocephalic.     Nose: Nose normal.     Mouth/Throat:     Mouth: Mucous membranes are moist.  Eyes:     Extraocular Movements: Extraocular movements intact.     Pupils: Pupils are equal, round, and reactive to light.  Cardiovascular:     Rate and Rhythm: Normal rate and regular rhythm.     Pulses: Normal pulses.     Heart sounds: Normal heart sounds.  Pulmonary:     Effort: Pulmonary effort is normal.     Breath sounds: Normal breath sounds.  Abdominal:     General: Abdomen is flat.     Palpations: Abdomen is soft.  Musculoskeletal:     Cervical back: Normal range of motion and neck supple.     Comments: 1+ edema around the right ankle and calf area.  Patient has no obvious ankle  deformity.  Patient has mild right calf tenderness.  She has no thigh tenderness and good peripheral pulses in the right lower extremity  Skin:    General: Skin is warm.     Capillary Refill: Capillary refill takes less than 2 seconds.  Neurological:     General: No focal deficit present.     Mental Status: She is alert and oriented to person, place, and time.  Psychiatric:        Mood and Affect: Mood normal.        Behavior: Behavior normal.    ED Results / Procedures / Treatments   Labs (all labs ordered are listed, but only abnormal results are displayed) Labs Reviewed - No data to display  EKG None  Radiology 07/01/2021 Venous Img Lower Unilateral Right (DVT)  Result Date: 05/27/2021 CLINICAL DATA:  Right lower extremity swelling EXAM: RIGHT LOWER EXTREMITY VENOUS DOPPLER ULTRASOUND TECHNIQUE: Gray-scale sonography with graded compression, as well as color Doppler and duplex ultrasound were performed to evaluate the lower extremity deep venous systems from the level of the common femoral vein and including the common femoral, femoral, profunda femoral, popliteal and calf veins including the posterior tibial, peroneal and gastrocnemius veins when visible. The superficial great saphenous vein was also interrogated. Spectral Doppler was utilized to evaluate flow at rest and with distal augmentation maneuvers in the common femoral, femoral and popliteal veins. COMPARISON:  None. FINDINGS: Contralateral Common Femoral Vein: Respiratory phasicity is normal and symmetric with the symptomatic side. No evidence of thrombus. Normal compressibility. Common Femoral Vein: No evidence of thrombus. Normal compressibility, respiratory phasicity and response to augmentation. Saphenofemoral Junction: No evidence of thrombus. Normal compressibility and flow on color Doppler imaging. Profunda Femoral Vein: No evidence of thrombus. Normal compressibility and flow on color Doppler imaging. Femoral Vein: No evidence of  thrombus. Normal compressibility, respiratory phasicity and response to augmentation. Popliteal Vein: No evidence of thrombus. Normal compressibility, respiratory phasicity and response to augmentation. Calf Veins: No evidence of thrombus. Normal compressibility and flow on color Doppler imaging. IMPRESSION: No evidence of deep venous thrombosis. Electronically Signed   By: 05/29/2021.  Shick M.D.   On: 05/27/2021 16:54    Procedures Procedures   Medications Ordered in ED Medications -  No data to display  ED Course  I have reviewed the triage vital signs and the nursing notes.  Pertinent labs & imaging results that were available during my care of the patient were reviewed by me and considered in my medical decision making (see chart for details).    MDM Rules/Calculators/A&P                          Rhyleigh Grassel is a 25 y.o. female here presenting with right leg swelling.  Suspect venous insufficiency.  Given that it is unilateral and she is on contraceptives consider DVT as well.  We will get DVT study.  Patient has no signs of trauma  5:03 PM DVT study negative.  Likely venous insufficiency.  I recommend compression stockings and leg elevation   Final Clinical Impression(s) / ED Diagnoses Final diagnoses:  None    Rx / DC Orders ED Discharge Orders     None        Charlynne Pander, MD 05/27/21 [phone]

## 2021-05-27 NOTE — ED Triage Notes (Signed)
C/o right ankle pain which radiates up to knee x 2 days , denies injury

## 2022-05-18 ENCOUNTER — Encounter (HOSPITAL_BASED_OUTPATIENT_CLINIC_OR_DEPARTMENT_OTHER): Payer: Self-pay | Admitting: Urology

## 2022-05-18 ENCOUNTER — Emergency Department (HOSPITAL_BASED_OUTPATIENT_CLINIC_OR_DEPARTMENT_OTHER): Payer: Medicaid Other

## 2022-05-18 ENCOUNTER — Emergency Department (HOSPITAL_BASED_OUTPATIENT_CLINIC_OR_DEPARTMENT_OTHER)
Admission: EM | Admit: 2022-05-18 | Discharge: 2022-05-18 | Disposition: A | Discharge status: Home / Self Care | Payer: Medicaid Other | Attending: Emergency Medicine | Admitting: Emergency Medicine

## 2022-05-18 DIAGNOSIS — O9A212 Injury, poisoning and certain other consequences of external causes complicating pregnancy, second trimester: Secondary | ICD-10-CM | POA: Diagnosis present

## 2022-05-18 DIAGNOSIS — S8012XA Contusion of left lower leg, initial encounter: Secondary | ICD-10-CM | POA: Insufficient documentation

## 2022-05-18 MED ORDER — ACETAMINOPHEN 500 MG PO TABS
1000.0000 mg | ORAL_TABLET | Freq: Once | ORAL | Status: AC
  Administered 2022-05-18: 1000 mg via ORAL
  Filled 2022-05-18: qty 2

## 2022-05-18 NOTE — ED Notes (Signed)
ED Provider at bedside. 
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
## 2022-05-18 NOTE — ED Notes (Signed)
EDP [place] and RN Brayton Caves updated re: ob cleared
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
## 2022-05-18 NOTE — ED Notes (Signed)
Pt obstetrically cleared per Erin at [facility]
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
## 2022-05-18 NOTE — Discharge Instructions (Signed)
X-ray showed no fracture.  Overall suspect that you have a contusion to your leg.  Recommend ice.  Use crutches to help support you as well.  Recommend 1000 mg of Tylenol every 6 hours as needed for pain.  If you develop any severe abdominal pain, vaginal bleeding, water leakage please call your OB.

## 2023-02-21 IMAGING — US US EXTREM LOW VENOUS*R*
1 series · 13 of 24 positions shown · non-contrast
Comparison: None.

CLINICAL DATA: Right lower extremity swelling



[Series 1: us extrem low venous*right* · 13 of 30 slices shown]
[im 1/30]
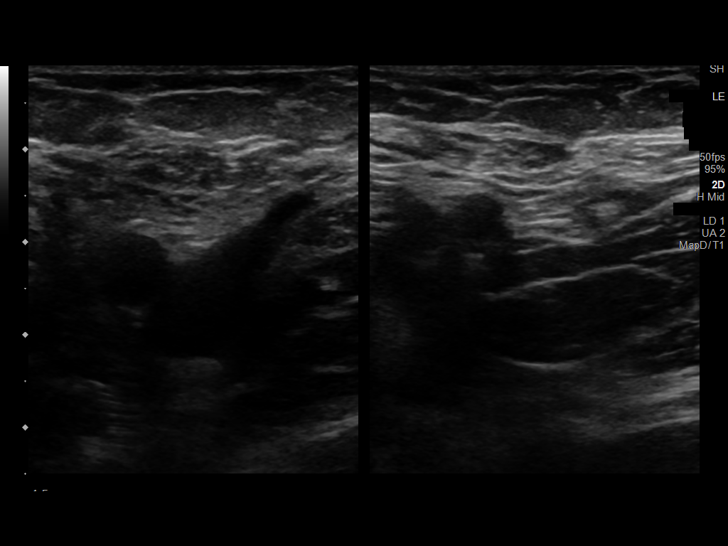
[im 3/30]
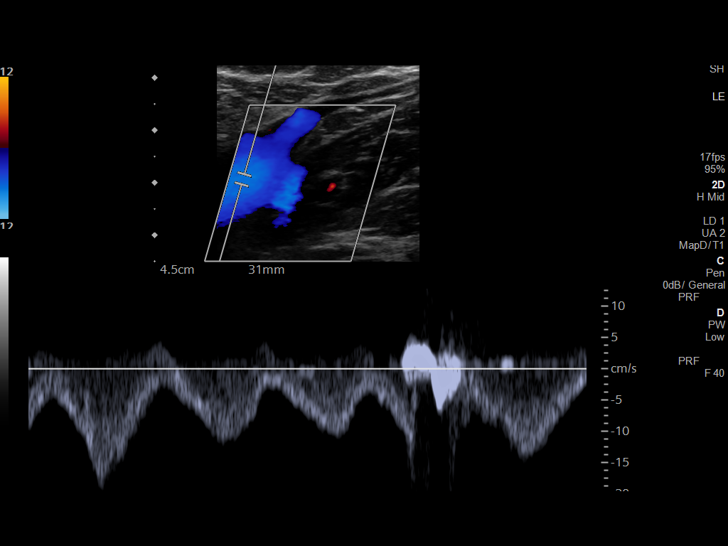
[im 6/30]
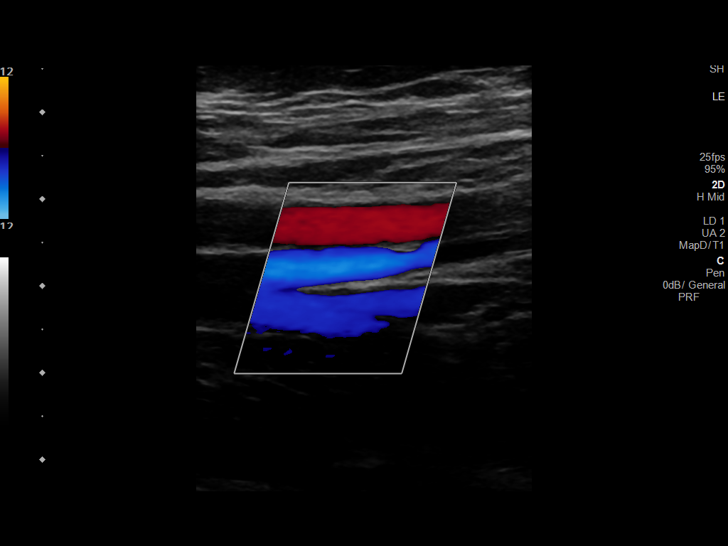
[im 8/30]
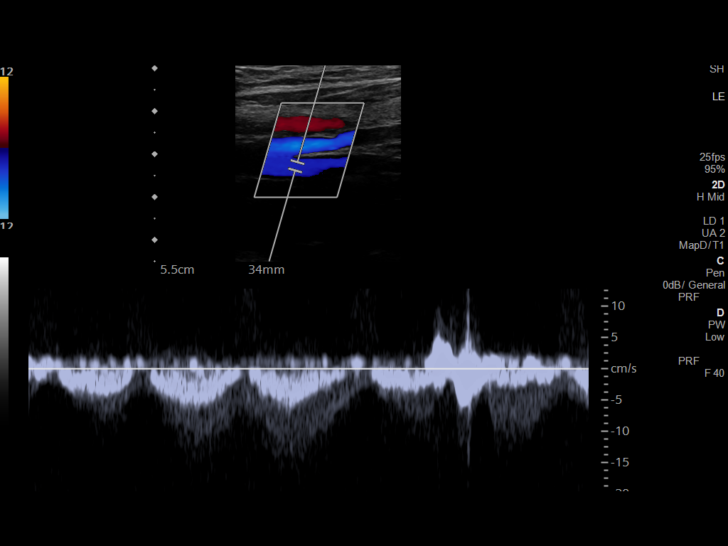
[im 11/30]
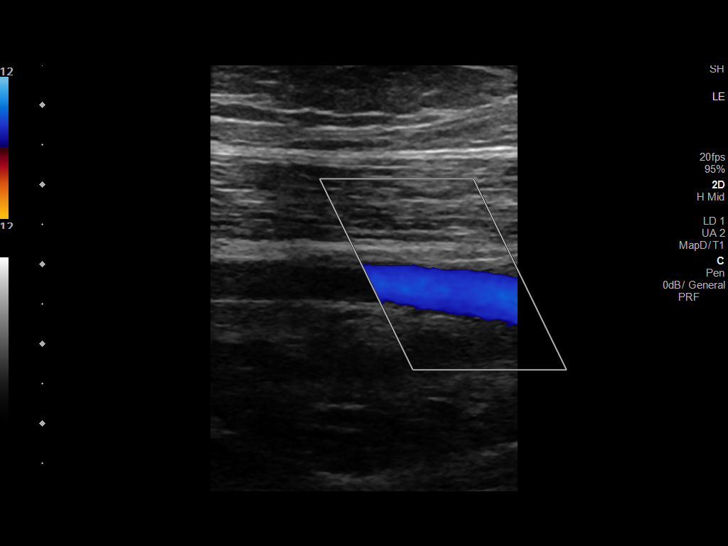
[im 13/30]
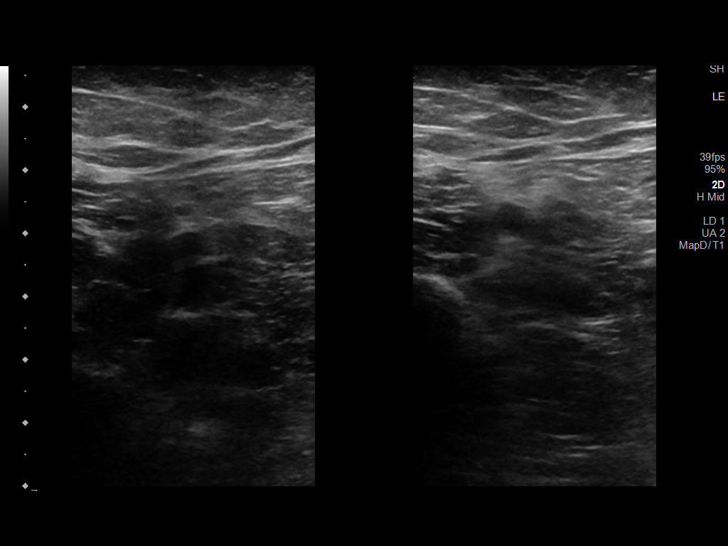
[im 16/30]
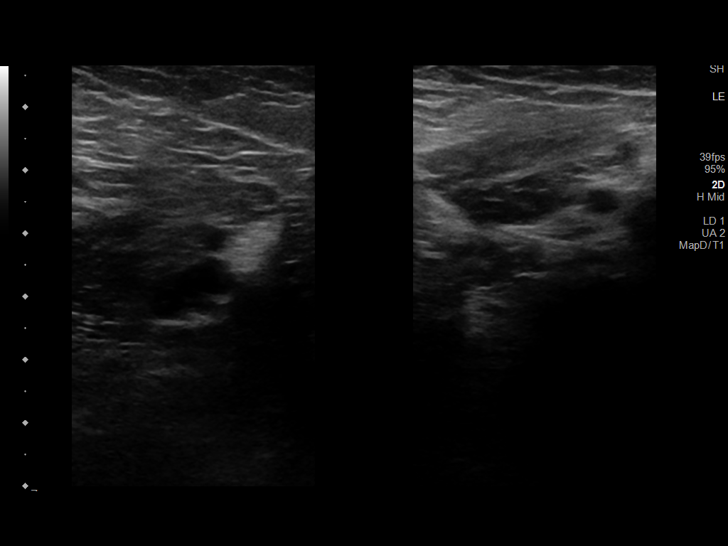
[im 17/30]
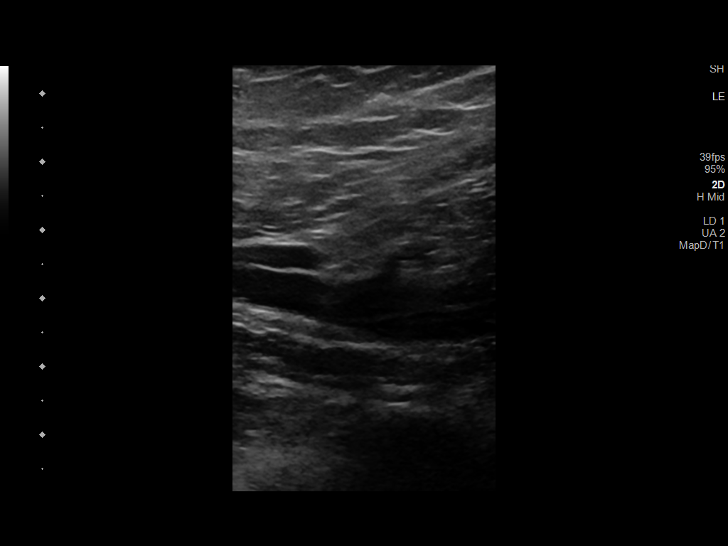
[im 19/30]
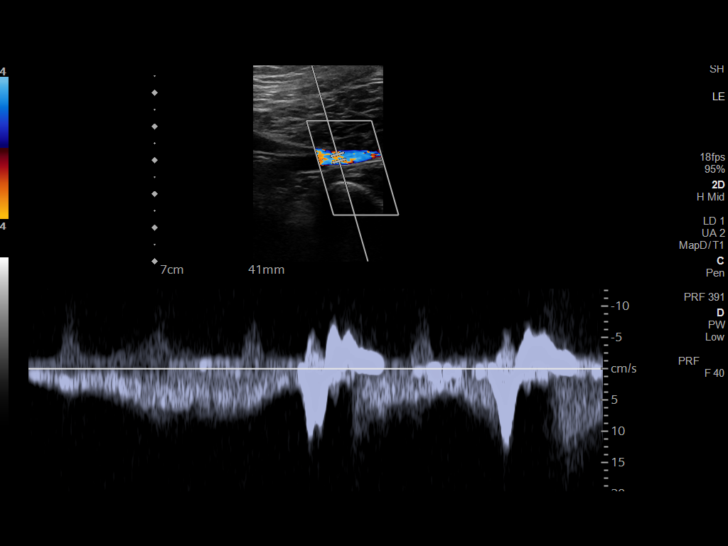
[im 22/30]
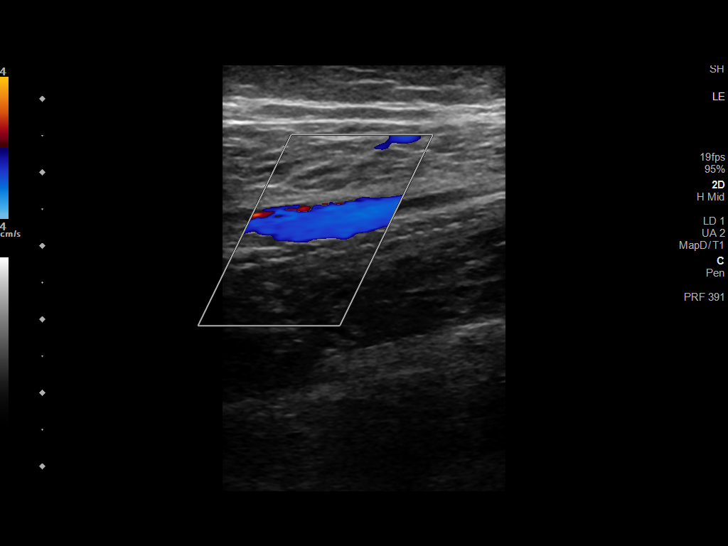
[im 24/30]
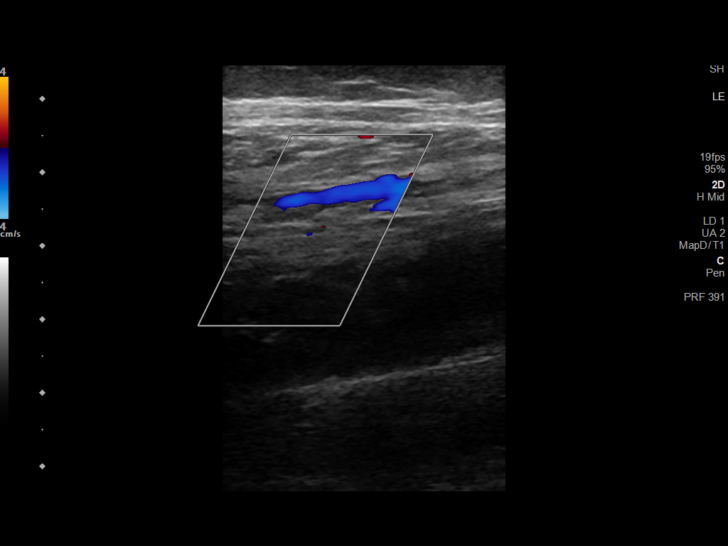
[im 27/30]
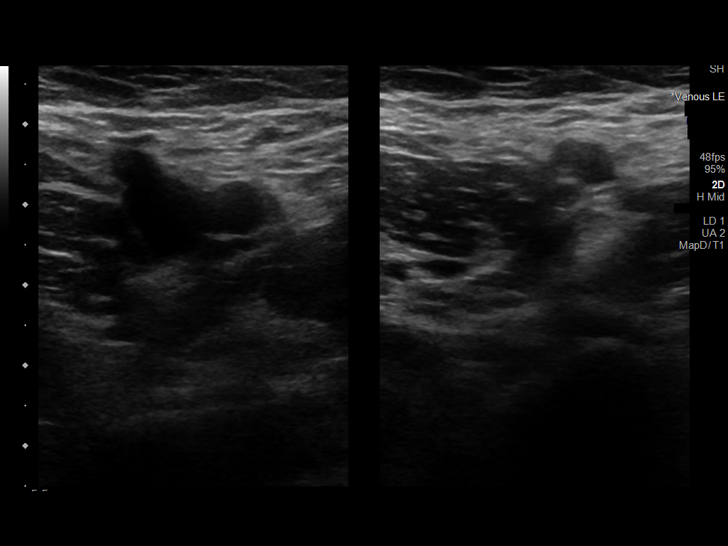
[im 30/30]
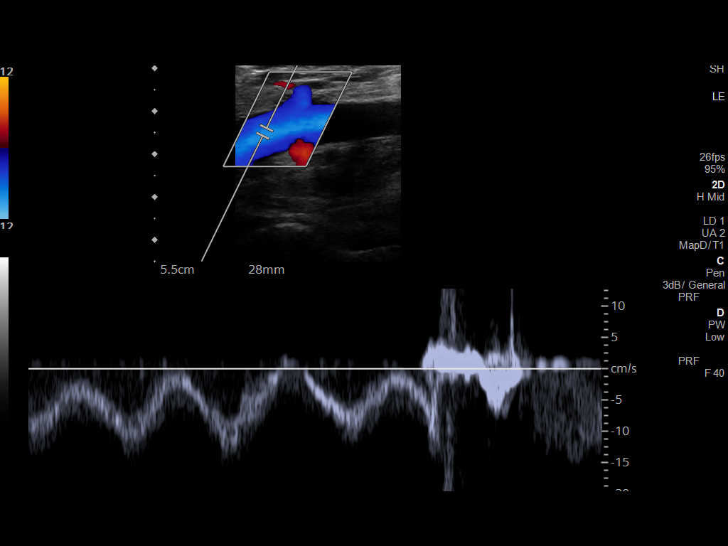

[13 of 24 positions shown; findings below may reference images not displayed]

FINDINGS: Contralateral Common Femoral Vein: Respiratory phasicity is normal
and symmetric with the symptomatic side. No evidence of thrombus.
Normal compressibility.

Common Femoral Vein: No evidence of thrombus. Normal
compressibility, respiratory phasicity and response to augmentation.

Saphenofemoral Junction: No evidence of thrombus. Normal
compressibility and flow on color Doppler imaging.

Profunda Femoral Vein: No evidence of thrombus. Normal
compressibility and flow on color Doppler imaging.

Femoral Vein: No evidence of thrombus. Normal compressibility,
respiratory phasicity and response to augmentation.

Popliteal Vein: No evidence of thrombus. Normal compressibility,
respiratory phasicity and response to augmentation.

Calf Veins: No evidence of thrombus. Normal compressibility and flow
on color Doppler imaging.
IMPRESSION: No evidence of deep venous thrombosis.

## 2023-10-26 ENCOUNTER — Emergency Department (HOSPITAL_BASED_OUTPATIENT_CLINIC_OR_DEPARTMENT_OTHER): Payer: Medicaid Other

## 2023-10-26 ENCOUNTER — Other Ambulatory Visit: Payer: Self-pay

## 2023-10-26 ENCOUNTER — Encounter (HOSPITAL_BASED_OUTPATIENT_CLINIC_OR_DEPARTMENT_OTHER): Payer: Self-pay | Admitting: Emergency Medicine

## 2023-10-26 DIAGNOSIS — Z5321 Procedure and treatment not carried out due to patient leaving prior to being seen by health care provider: Secondary | ICD-10-CM | POA: Insufficient documentation

## 2023-10-26 DIAGNOSIS — M79606 Pain in leg, unspecified: Secondary | ICD-10-CM | POA: Diagnosis not present

## 2023-10-26 DIAGNOSIS — R079 Chest pain, unspecified: Secondary | ICD-10-CM | POA: Diagnosis present

## 2023-10-26 LAB — CBC
HCT: 37.3 % (ref 36.0–46.0)
Hemoglobin: 12.8 g/dL (ref 12.0–15.0)
MCH: 26.3 pg (ref 26.0–34.0)
MCHC: 34.3 g/dL (ref 30.0–36.0)
MCV: 76.7 fL — ABNORMAL LOW (ref 80.0–100.0)
Platelets: 365 10*3/uL (ref 150–400)
RBC: 4.86 MIL/uL (ref 3.87–5.11)
RDW: 14.4 % (ref 11.5–15.5)
WBC: 7.7 10*3/uL (ref 4.0–10.5)
nRBC: 0 % (ref 0.0–0.2)

## 2023-10-26 LAB — BASIC METABOLIC PANEL
Anion gap: 5 (ref 5–15)
BUN: 10 mg/dL (ref 6–20)
CO2: 23 mmol/L (ref 22–32)
Calcium: 9.1 mg/dL (ref 8.9–10.3)
Chloride: 109 mmol/L (ref 98–111)
Creatinine, Ser: 0.64 mg/dL (ref 0.44–1.00)
GFR, Estimated: >60 mL/min (ref >60)
Glucose, Bld: 84 mg/dL (ref 70–99)
Potassium: 3.6 mmol/L (ref 3.5–5.1)
Sodium: 137 mmol/L (ref 135–145)

## 2023-10-26 LAB — TROPONIN I (HIGH SENSITIVITY): Troponin I (High Sensitivity): <2 ng/L (ref <18)

## 2023-10-26 LAB — PREGNANCY, URINE: Preg Test, Ur: NEGATIVE

## 2023-10-26 NOTE — ED Triage Notes (Signed)
Chest pain since last week, started as leg pain, PCP told her if has any CP/Leg pain to get evaluated for blood clot due to birth control.

## 2023-10-27 ENCOUNTER — Emergency Department (HOSPITAL_BASED_OUTPATIENT_CLINIC_OR_DEPARTMENT_OTHER)
Admission: EM | Admit: 2023-10-27 | Discharge: 2023-10-27 | Discharge status: Against Medical Advice | Payer: Medicaid Other | Attending: Emergency Medicine | Admitting: Emergency Medicine

## 2023-12-24 ENCOUNTER — Emergency Department (HOSPITAL_BASED_OUTPATIENT_CLINIC_OR_DEPARTMENT_OTHER): Payer: Medicaid Other

## 2023-12-24 ENCOUNTER — Other Ambulatory Visit: Payer: Self-pay

## 2023-12-24 ENCOUNTER — Encounter (HOSPITAL_BASED_OUTPATIENT_CLINIC_OR_DEPARTMENT_OTHER): Payer: Self-pay | Admitting: Emergency Medicine

## 2023-12-24 ENCOUNTER — Emergency Department (HOSPITAL_BASED_OUTPATIENT_CLINIC_OR_DEPARTMENT_OTHER)
Admission: EM | Admit: 2023-12-24 | Discharge: 2023-12-24 | Disposition: A | Discharge status: Home / Self Care | Payer: Medicaid Other | Attending: Emergency Medicine | Admitting: Emergency Medicine

## 2023-12-24 DIAGNOSIS — R059 Cough, unspecified: Secondary | ICD-10-CM | POA: Diagnosis present

## 2023-12-24 DIAGNOSIS — J069 Acute upper respiratory infection, unspecified: Secondary | ICD-10-CM | POA: Insufficient documentation

## 2023-12-24 LAB — RESP PANEL BY RT-PCR (RSV, FLU A&B, COVID)  RVPGX2
Influenza A by PCR: NEGATIVE
Influenza B by PCR: NEGATIVE
Resp Syncytial Virus by PCR: NEGATIVE
SARS Coronavirus 2 by RT PCR: NEGATIVE

## 2023-12-24 NOTE — Discharge Instructions (Signed)
 You appear to have an upper respiratory infection (URI). An upper respiratory tract infection, or cold, is a viral infection of the air passages leading to the lungs. It should improve gradually after 5-7 days. You may have a lingering cough that lasts for 2- 4 weeks after the infection.  Your flu, covid, and RSV test were negative today.  Your chest x-ray does not show any signs of pneumonia  There are no medications, such as antibiotics, that will cure your infection.   Home care instructions:  You can take Tylenol and/or Ibuprofen as directed on the packaging for fever reduction and pain relief.    For cough: honey 1/2 to 1 teaspoon (you can dilute the honey in water or another fluid).  You can also use guaifenesin and dextromethorphan for cough which are over-the-counter medications. You can use a humidifier for chest congestion and cough.  If you don't have a humidifier, you can sit in the bathroom with the hot shower running.      For sore throat: try warm salt water gargles, cepacol lozenges, throat spray, warm tea or water with lemon/honey, popsicles or ice, or OTC cold relief medicine for throat discomfort.    For congestion: Flonase 1-2 sprays in each nostril daily.    It is important to stay hydrated: drink plenty of fluids (water, gatorade/powerade/pedialyte, juices, or teas) to keep your throat moisturized and help further relieve irritation/discomfort.   Your illness is contagious and can be spread to others, especially during the first 3 or 4 days. It cannot be cured by antibiotics or other medicines. Take basic precautions such as washing your hands often, covering your mouth when you cough or sneeze, and avoiding public places where you could spread your illness to others.   Follow-up instructions: Please follow-up with your primary care provider for further evaluation of your symptoms if you are not feeling better within the next 5 days.   Return instructions:  Please return  to the Emergency Department if you experience worsening symptoms.  RETURN IMMEDIATELY IF you develop shortness of breath, confusion or altered mental status, a new rash, become dizzy, faint, or poorly responsive, or are unable to be cared for at home. Please return if you have persistent vomiting and cannot keep down fluids or develop a fever that is not controlled by tylenol or motrin.   Please return if you have any other emergent concerns.

## 2023-12-24 NOTE — ED Provider Notes (Signed)
 [place] [facility] AT [facility] Provider Note   CSN: [id number] Arrival date & time: 12/24/23  1249     History  Chief Complaint  Patient presents with   Cough    Donna Bartlett is a 28 y.o. female with no significant past medical history presents with concern for runny nose, cough, and sneezing that started about 3 days ago.  States the cough is productive with some green mucus.  Denies any shortness of breath.  Denies any abdominal pain, nausea or vomiting.   Cough      Home Medications Prior to Admission medications   Medication Sig Start Date End Date Taking? Authorizing Provider  benzonatate (TESSALON) 100 MG capsule Take 1 capsule (100 mg total) by mouth every 8 (eight) hours. 04/10/18   Petrucelli, Samantha R, PA-C  fluticasone (FLONASE) 50 MCG/ACT nasal spray Place 1 spray into both nostrils daily. 04/10/18   Petrucelli, Samantha R, PA-C  metroNIDAZOLE (FLAGYL) 500 MG tablet Take 1 tablet (500 mg total) by mouth 2 (two) times daily. 01/01/21   Graciella Freer A, PA-C  ondansetron (ZOFRAN-ODT) 4 MG disintegrating tablet DISSOLVE 1 TABLET(4 MG) ON THE TONGUE EVERY 6 HOURS AS NEEDED FOR NAUSEA 12/10/19   Aviva Signs, CNM  Prenatal Vit-Fe Fumarate-FA (MULTIVITAMIN-PRENATAL) 27-0.8 MG TABS tablet Take 1 tablet by mouth daily at 12 noon.    [provider]  promethazine (PHENERGAN) 25 MG tablet Take 1 tablet (25 mg total) by mouth every 6 (six) hours as needed for nausea or vomiting. 10/26/19   Geoffery Lyons, MD  ranitidine (ZANTAC) 150 MG tablet Take 1 tablet (150 mg total) by mouth 2 (two) times daily. 02/25/17 03/04/17  Shaune Pollack, MD  sucralfate (CARAFATE) 1 GM/10ML suspension Take 10 mLs (1 g total) by mouth 4 (four) times daily -  with meals and at bedtime. 02/25/17 03/04/17  Shaune Pollack, MD      Allergies    Other    Review of Systems   Review of Systems  Respiratory:  Positive for cough.     Physical Exam Updated Vital  Signs BP 128/89 (BP Location: Right Arm)   Pulse 100   Temp 99.2 F (37.3 C)   Resp 17   Ht 5\' 5"  (1.651 m)   Wt 89.8 kg   LMP 12/03/2023   SpO2 100%   BMI 32.95 kg/m  Physical Exam Vitals and nursing note reviewed.  Constitutional:      General: She is not in acute distress.    Appearance: She is well-developed.  HENT:     Head: Normocephalic and atraumatic.     Nose: Rhinorrhea present.     Mouth/Throat:     Comments: Posterior oropharynx without any erythema or edema.  No tonsillar exudate. Eyes:     Conjunctiva/sclera: Conjunctivae normal.  Cardiovascular:     Rate and Rhythm: Normal rate and regular rhythm.     Heart sounds: No murmur heard. Pulmonary:     Effort: Pulmonary effort is normal. No respiratory distress.     Breath sounds: Normal breath sounds.  Abdominal:     Palpations: Abdomen is soft.     Tenderness: There is no abdominal tenderness.  Musculoskeletal:        General: No swelling.     Cervical back: Neck supple.  Skin:    General: Skin is warm and dry.     Capillary Refill: Capillary refill takes less than 2 seconds.  Neurological:     Mental Status: She  is alert.  Psychiatric:        Mood and Affect: Mood normal.     ED Results / Procedures / Treatments   Labs (all labs ordered are listed, but only abnormal results are displayed) Labs Reviewed  RESP PANEL BY RT-PCR (RSV, FLU A&B, COVID)  [id number]    EKG None  Radiology DG Chest 2 View Result Date: 12/24/2023 CLINICAL DATA:  Cough for 3 days EXAM: CHEST - 2 VIEW COMPARISON:  None Available. FINDINGS: Normal mediastinum and cardiac silhouette. Normal pulmonary vasculature. No evidence of effusion, infiltrate, or pneumothorax. No acute bony abnormality. IMPRESSION: No acute cardiopulmonary process. Electronically Signed   By: Genevive Bi M.D.   On: 12/24/2023 14:59    Procedures Procedures    Medications Ordered in ED Medications - No data to display  ED Course/ Medical Decision  Making/ A&P                                 Medical Decision Making Amount and/or Complexity of Data Reviewed Radiology: ordered.     Differential diagnosis includes but is not limited to COVID, flu, RSV, viral URI, strep pharyngitis, viral pharyngitis, allergic rhinitis, pneumonia, bronchitis   ED Course:  Patient well-appearing, stable vital signs.  Presents with URI type symptoms for the past 3 days.  COVID, flu, RSV negative.  Chest x-ray without any signs of pneumonia.  Suspect viral URI as the cause of patient's symptoms.  She is eating and drinking well at home.  Stable and appropriate for discharge home at this time.   Impression: Viral URI  Disposition:  The patient was discharged home with instructions to use OTC medications as needed for symptom control. Follow up with PCP if symptoms not improving within the next 5 days. Work note provided Return precautions given.  Imaging Studies ordered: I ordered imaging studies including Chest x-ray  I independently visualized the imaging with scope of interpretation limited to determining acute life threatening conditions related to emergency care. Imaging showed No acute abnormalities, no consolidations I agree with the radiologist interpretation                 Final Clinical Impression(s) / ED Diagnoses Final diagnoses:  Viral URI with cough    Rx / DC Orders ED Discharge Orders     None         Arabella Merles, PA-C 12/24/23 1526    Cathren Laine, MD 12/25/23 [phone]

## 2023-12-24 NOTE — ED Notes (Signed)
 Patient transported to X-ray

## 2023-12-24 NOTE — ED Triage Notes (Signed)
 Pt c/o runny nose, cough, sneezing
